# Patient Record
Sex: Female | Born: 1955 | Race: Black or African American | Hispanic: No | Smoking: Former smoker
Health system: Southern US, Community
[De-identification: ages and names within clinical notes are randomized; demographics above are authoritative.]

## PROBLEM LIST (undated history)

## (undated) DIAGNOSIS — Z531 Procedure and treatment not carried out because of patient's decision for reasons of belief and group pressure: Secondary | ICD-10-CM

## (undated) DIAGNOSIS — R519 Headache, unspecified: Secondary | ICD-10-CM

## (undated) DIAGNOSIS — K56609 Unspecified intestinal obstruction, unspecified as to partial versus complete obstruction: Secondary | ICD-10-CM

## (undated) DIAGNOSIS — Z8489 Family history of other specified conditions: Secondary | ICD-10-CM

## (undated) DIAGNOSIS — T7840XA Allergy, unspecified, initial encounter: Secondary | ICD-10-CM

## (undated) DIAGNOSIS — R011 Cardiac murmur, unspecified: Secondary | ICD-10-CM

## (undated) DIAGNOSIS — R51 Headache: Secondary | ICD-10-CM

## (undated) DIAGNOSIS — J189 Pneumonia, unspecified organism: Secondary | ICD-10-CM

## (undated) DIAGNOSIS — J302 Other seasonal allergic rhinitis: Secondary | ICD-10-CM

## (undated) DIAGNOSIS — G43909 Migraine, unspecified, not intractable, without status migrainosus: Secondary | ICD-10-CM

## (undated) DIAGNOSIS — IMO0001 Reserved for inherently not codable concepts without codable children: Secondary | ICD-10-CM

## (undated) DIAGNOSIS — K219 Gastro-esophageal reflux disease without esophagitis: Secondary | ICD-10-CM

## (undated) DIAGNOSIS — M1990 Unspecified osteoarthritis, unspecified site: Secondary | ICD-10-CM

## (undated) DIAGNOSIS — D649 Anemia, unspecified: Secondary | ICD-10-CM

## (undated) HISTORY — DX: Headache, unspecified: R51.9

## (undated) HISTORY — DX: Unspecified osteoarthritis, unspecified site: M19.90

## (undated) HISTORY — PX: DILATION AND CURETTAGE OF UTERUS: SHX78

## (undated) HISTORY — DX: Migraine, unspecified, not intractable, without status migrainosus: G43.909

## (undated) HISTORY — PX: ABDOMINAL HYSTERECTOMY: SHX81

## (undated) HISTORY — PX: TONSILLECTOMY AND ADENOIDECTOMY: SUR1326

## (undated) HISTORY — DX: Allergy, unspecified, initial encounter: T78.40XA

## (undated) HISTORY — PX: GANGLION CYST EXCISION: SHX1691

## (undated) HISTORY — PX: APPENDECTOMY: SHX54

## (undated) HISTORY — DX: Headache: R51

## (undated) HISTORY — DX: Cardiac murmur, unspecified: R01.1

---

## 1981-07-27 HISTORY — PX: EXPLORATORY LAPAROTOMY: SUR591

## 2008-07-27 HISTORY — PX: BREAST BIOPSY: SHX20

## 2015-08-21 ENCOUNTER — Ambulatory Visit (INDEPENDENT_AMBULATORY_CARE_PROVIDER_SITE_OTHER): Payer: 59 | Admitting: Internal Medicine

## 2015-08-21 ENCOUNTER — Encounter: Payer: Self-pay | Admitting: Internal Medicine

## 2015-08-21 VITALS — BP 110/78 | HR 73 | Temp 98.5°F | Resp 16 | Ht 63.5 in | Wt 214.0 lb

## 2015-08-21 DIAGNOSIS — J3089 Other allergic rhinitis: Secondary | ICD-10-CM | POA: Diagnosis not present

## 2015-08-21 DIAGNOSIS — J309 Allergic rhinitis, unspecified: Secondary | ICD-10-CM | POA: Insufficient documentation

## 2015-08-21 DIAGNOSIS — K219 Gastro-esophageal reflux disease without esophagitis: Secondary | ICD-10-CM

## 2015-08-21 DIAGNOSIS — E669 Obesity, unspecified: Secondary | ICD-10-CM | POA: Diagnosis not present

## 2015-08-21 NOTE — Patient Instructions (Signed)
We will send the cologuard out to your house to check for polyps. As we talked about there is a 5% miss rate with the test and about a 5% false positive rate.   We will get your records and see you back sometime for your physical this summer.   Keep working on exercise daily, when you are trying to lose weight the recommendation is 45 minutes a day for 4-5 days per week.   Exercising to Stay Healthy Exercising regularly is important. It has many health benefits, such as:  Improving your overall fitness, flexibility, and endurance.  Increasing your bone density.  Helping with weight control.  Decreasing your body fat.  Increasing your muscle strength.  Reducing stress and tension.  Improving your overall health. In order to become healthy and stay healthy, it is recommended that you do moderate-intensity and vigorous-intensity exercise. You can tell that you are exercising at a moderate intensity if you have a higher heart rate and faster breathing, but you are still able to hold a conversation. You can tell that you are exercising at a vigorous intensity if you are breathing much harder and faster and cannot hold a conversation while exercising. HOW OFTEN SHOULD I EXERCISE? Choose an activity that you enjoy and set realistic goals. Your health care provider can help you to make an activity plan that works for you. Exercise regularly as directed by your health care provider. This may include:   Doing resistance training twice each week, such as:  Push-ups.  Sit-ups.  Lifting weights.  Using resistance bands.  Doing a given intensity of exercise for a given amount of time. Choose from these options:  150 minutes of moderate-intensity exercise every week.  75 minutes of vigorous-intensity exercise every week.  A mix of moderate-intensity and vigorous-intensity exercise every week. Children, pregnant women, people who are out of shape, people who are overweight, and older  adults may need to consult a health care provider for individual recommendations. If you have any sort of medical condition, be sure to consult your health care provider before starting a new exercise program.  WHAT ARE SOME EXERCISE IDEAS? Some moderate-intensity exercise ideas include:   Walking at a rate of 1 mile in 15 minutes.  Biking.  Hiking.  Golfing.  Dancing. Some vigorous-intensity exercise ideas include:   Walking at a rate of at least 4.5 miles per hour.  Jogging or running at a rate of 5 miles per hour.  Biking at a rate of at least 10 miles per hour.  Lap swimming.  Roller-skating or in-line skating.  Cross-country skiing.  Vigorous competitive sports, such as football, basketball, and soccer.  Jumping rope.  Aerobic dancing. WHAT ARE SOME EVERYDAY ACTIVITIES THAT CAN HELP ME TO GET EXERCISE?  Yard work, such as:  Child psychotherapist.  Raking and bagging leaves.  Washing and waxing your car.  Pushing a stroller.  Shoveling snow.  Gardening.  Washing windows or floors. HOW CAN I BE MORE ACTIVE IN MY DAY-TO-DAY ACTIVITIES?  Use the stairs instead of the elevator.  Take a walk during your lunch break.  If you drive, park your car farther away from work or school.  If you take public transportation, get off one stop early and walk the rest of the way.  Make all of your phone calls while standing up and walking around.  Get up, stretch, and walk around every 30 minutes throughout the day. WHAT GUIDELINES SHOULD I FOLLOW WHILE EXERCISING?  Do not exercise so much that you hurt yourself, feel dizzy, or get very short of breath.  Consult your health care provider before starting a new exercise program.  Wear comfortable clothes and shoes with good support.  Drink plenty of water while you exercise to prevent dehydration or heat stroke. Body water is lost during exercise and must be replaced.  Work out until you breathe faster and your  heart beats faster.   This information is not intended to replace advice given to you by your health care provider. Make sure you discuss any questions you have with your health care provider.   Document Released: 08/15/2010 Document Revised: 08/03/2014 Document Reviewed: 12/14/2013 Elsevier Interactive Patient Education [employer]

## 2015-08-21 NOTE — Progress Notes (Signed)
Pre visit review using our clinic review tool, if applicable. No additional management support is needed unless otherwise documented below in the visit note. 

## 2015-08-21 NOTE — Assessment & Plan Note (Signed)
She is advised to avoid triggers. She will also work on weight loss to help with symptoms. She has never had colonoscopy and reminded her of the benefit with cancer screening. She agrees to cologuard which is ordered today.

## 2015-08-21 NOTE — Assessment & Plan Note (Signed)
She does not require medication at this time and does not want skin testing. She will avoid triggers and see if she is able to resume her old eye makeup in several weeks.

## 2015-08-21 NOTE — Assessment & Plan Note (Signed)
She will work on diet and exercise to help with her weight. She is up right now and does not like being this weight.

## 2015-08-21 NOTE — Progress Notes (Signed)
   Subjective:    Patient ID: Angela Shannon, female    DOB: [birth year], 60 y.o.   MRN: [id number]  HPI The patient is a new 60 YO female coming in for heart burn and some possible allergies. She does not take any medicines. Since moving to [facility] she has noticed some hypersensitivity of her sinuses. She has also had reaction to her eye makeup several times. Prior to the reaction she changed nails, had a hair treatment, and changed makeup. She has since stopped using facial makeup for a week or so and the reaction is better.  She has noticed night time GERD with red wine and so she switched to white wine. This has still given her reaction. She then stopped drinking wine about 5 weeks ago and has had no problems since then. She did have small reaction with some tomato sauce last week. No weight change. No abdominal pains. No diarrhea or constipation or blood in stool.   PMH, [facility], social history reviewed and updated.   Review of Systems  Constitutional: Negative for fever, activity change, appetite change, fatigue and unexpected weight change.  HENT: Positive for congestion and postnasal drip. Negative for nosebleeds, rhinorrhea, sore throat and trouble swallowing.   Eyes: Negative.   Respiratory: Negative for cough, chest tightness, shortness of breath and wheezing.   Cardiovascular: Negative for chest pain, palpitations and leg swelling.  Gastrointestinal: Negative for nausea, abdominal pain, diarrhea, constipation and abdominal distention.       GERD  Musculoskeletal: Negative for myalgias, back pain, arthralgias and neck pain.  Skin: Negative.   Neurological: Negative.   Psychiatric/Behavioral: Negative.       Objective:   Physical Exam  Constitutional: She is oriented to person, place, and time. She appears well-developed and well-nourished.  HENT:  Head: Normocephalic and atraumatic.  Mild redness of the oropharynx, no discharge or nasal crusting.  Eyes: EOM are normal.    Neck: Normal range of motion.  Cardiovascular: Normal rate and regular rhythm.   Pulmonary/Chest: Effort normal and breath sounds normal. No respiratory distress. She has no wheezes. She has no rales.  Abdominal: Soft. Bowel sounds are normal. She exhibits no distension. There is no tenderness. There is no rebound.  Musculoskeletal: She exhibits no edema.  Neurological: She is alert and oriented to person, place, and time. Coordination normal.  Skin: Skin is warm and dry.  Psychiatric: She has a normal mood and affect.   Filed Vitals:   08/21/15 1010  BP: 110/78  Pulse: 73  Temp: 98.5 F (36.9 C)  TempSrc: Oral  Resp: 16  Height: 5' 3.5" (1.613 m)  Weight: 214 lb (97.07 kg)  SpO2: 96%      Assessment & Plan:

## 2015-10-05 LAB — COLOGUARD

## 2015-11-27 ENCOUNTER — Encounter: Payer: Self-pay | Admitting: Internal Medicine

## 2015-11-27 ENCOUNTER — Ambulatory Visit (INDEPENDENT_AMBULATORY_CARE_PROVIDER_SITE_OTHER): Payer: 59 | Admitting: Internal Medicine

## 2015-11-27 ENCOUNTER — Other Ambulatory Visit (INDEPENDENT_AMBULATORY_CARE_PROVIDER_SITE_OTHER): Payer: 59

## 2015-11-27 VITALS — BP 104/70 | HR 79 | Temp 98.6°F | Resp 18 | Ht 63.5 in | Wt 219.0 lb

## 2015-11-27 DIAGNOSIS — Z0000 Encounter for general adult medical examination without abnormal findings: Secondary | ICD-10-CM

## 2015-11-27 LAB — COMPREHENSIVE METABOLIC PANEL
ALBUMIN: 4.1 g/dL (ref 3.5–5.2)
ALT: 9 U/L (ref 0–35)
AST: 12 U/L (ref 0–37)
Alkaline Phosphatase: 54 U/L (ref 39–117)
BUN: 14 mg/dL (ref 6–23)
CHLORIDE: 104 meq/L (ref 96–112)
CO2: 29 mEq/L (ref 19–32)
Calcium: 9.3 mg/dL (ref 8.4–10.5)
Creatinine, Ser: 0.88 mg/dL (ref 0.40–1.20)
GFR: 84.5 mL/min (ref >60.00)
Glucose, Bld: 101 mg/dL — ABNORMAL HIGH (ref 70–99)
POTASSIUM: 4 meq/L (ref 3.5–5.1)
SODIUM: 140 meq/L (ref 135–145)
Total Bilirubin: 0.8 mg/dL (ref 0.2–1.2)
Total Protein: 6.9 g/dL (ref 6.0–8.3)

## 2015-11-27 LAB — CBC
HEMATOCRIT: 40.8 % (ref 36.0–46.0)
Hemoglobin: 14 g/dL (ref 12.0–15.0)
MCHC: 34.4 g/dL (ref 30.0–36.0)
MCV: 85.7 fl (ref 78.0–100.0)
Platelets: 295 10*3/uL (ref 150.0–400.0)
RBC: 4.77 Mil/uL (ref 3.87–5.11)
RDW: 14 % (ref 11.5–15.5)
WBC: 5.6 10*3/uL (ref 4.0–10.5)

## 2015-11-27 LAB — LIPID PANEL
CHOLESTEROL: 171 mg/dL (ref 0–200)
HDL: 52.3 mg/dL (ref >39.00)
LDL CALC: 99 mg/dL (ref 0–99)
NonHDL: 119.07
TRIGLYCERIDES: 98 mg/dL (ref 0.0–149.0)
Total CHOL/HDL Ratio: 3
VLDL: 19.6 mg/dL (ref 0.0–40.0)

## 2015-11-27 LAB — HEMOGLOBIN A1C: HEMOGLOBIN A1C: 5.7 % (ref 4.6–6.5)

## 2015-11-27 NOTE — Progress Notes (Signed)
Pre visit review using our clinic review tool, if applicable. No additional management support is needed unless otherwise documented below in the visit note. 

## 2015-11-27 NOTE — Progress Notes (Signed)
   Subjective:    Patient ID: Angela Shannon, female    DOB: 28-Jun-1956, 60 y.o.   MRN: [id number]  HPI The patient is a 60 YO female coming in for wellness. No new concerns. Tried to do cologuard but was not able to complete due to cost. She does not want a colonoscopy.   PM[facility], [facility], social history reviewed and updated.   Review of Systems  Constitutional: Negative for fever, activity change, appetite change, fatigue and unexpected weight change.  HENT: Negative for nosebleeds, rhinorrhea, sore throat and trouble swallowing.   Eyes: Negative.   Respiratory: Negative for cough, chest tightness, shortness of breath and wheezing.   Cardiovascular: Negative for chest pain, palpitations and leg swelling.  Gastrointestinal: Negative for nausea, abdominal pain, diarrhea, constipation and abdominal distention.       GERD, mild  Musculoskeletal: Negative for myalgias, back pain, arthralgias and neck pain.  Skin: Negative.   Neurological: Negative.   Psychiatric/Behavioral: Negative.       Objective:   Physical Exam  Constitutional: She is oriented to person, place, and time. She appears well-developed and well-nourished.  HENT:  Head: Normocephalic and atraumatic.  Eyes: EOM are normal.  Neck: Normal range of motion.  Cardiovascular: Normal rate and regular rhythm.   Pulmonary/Chest: Effort normal and breath sounds normal. No respiratory distress. She has no wheezes. She has no rales.  Abdominal: Soft. Bowel sounds are normal. She exhibits no distension. There is no tenderness. There is no rebound.  Musculoskeletal: She exhibits no edema.  Neurological: She is alert and oriented to person, place, and time. Coordination normal.  Skin: Skin is warm and dry.  Psychiatric: She has a normal mood and affect.   Filed Vitals:   11/27/15 0930  BP: 104/70  Pulse: 79  Temp: 98.6 F (37 C)  TempSrc: Oral  Resp: 18  Height: 5' 3.5" (1.613 m)  Weight: 219 lb (99.338 kg)  SpO2: 98%     Assessment & Plan:

## 2015-11-27 NOTE — Assessment & Plan Note (Signed)
Not able to do cologuard due to cost at this time and does not want to do colonoscopy. Declines immunizations today. Checking labs and adjust as needed. Not on meds and non-smoker. Needs to start exercising. Given screening recommendations.

## 2015-11-27 NOTE — Patient Instructions (Signed)
We will get the blood work today and call you back with the results.   Come back in about 1 year for follow up.   Work on getting back into some exercise to help out with energy and stamina.   Exercising to Stay Healthy Exercising regularly is important. It has many health benefits, such as:  Improving your overall fitness, flexibility, and endurance.  Increasing your bone density.  Helping with weight control.  Decreasing your body fat.  Increasing your muscle strength.  Reducing stress and tension.  Improving your overall health. In order to become healthy and stay healthy, it is recommended that you do moderate-intensity and vigorous-intensity exercise. You can tell that you are exercising at a moderate intensity if you have a higher heart rate and faster breathing, but you are still able to hold a conversation. You can tell that you are exercising at a vigorous intensity if you are breathing much harder and faster and cannot hold a conversation while exercising. HOW OFTEN SHOULD I EXERCISE? Choose an activity that you enjoy and set realistic goals. Your health care provider can help you to make an activity plan that works for you. Exercise regularly as directed by your health care provider. This may include:   Doing resistance training twice each week, such as:  Push-ups.  Sit-ups.  Lifting weights.  Using resistance bands.  Doing a given intensity of exercise for a given amount of time. Choose from these options:  150 minutes of moderate-intensity exercise every week.  75 minutes of vigorous-intensity exercise every week.  A mix of moderate-intensity and vigorous-intensity exercise every week. Children, pregnant women, people who are out of shape, people who are overweight, and older adults may need to consult a health care provider for individual recommendations. If you have any sort of medical condition, be sure to consult your health care provider before starting a  new exercise program.  WHAT ARE SOME EXERCISE IDEAS? Some moderate-intensity exercise ideas include:   Walking at a rate of 1 mile in 15 minutes.  Biking.  Hiking.  Golfing.  Dancing. Some vigorous-intensity exercise ideas include:   Walking at a rate of at least 4.5 miles per hour.  Jogging or running at a rate of 5 miles per hour.  Biking at a rate of at least 10 miles per hour.  Lap swimming.  Roller-skating or in-line skating.  Cross-country skiing.  Vigorous competitive sports, such as football, basketball, and soccer.  Jumping rope.  Aerobic dancing. WHAT ARE SOME EVERYDAY ACTIVITIES THAT CAN HELP ME TO GET EXERCISE?  Yard work, such as:  Psychologist, educational.  Raking and bagging leaves.  Washing and waxing your car.  Pushing a stroller.  Shoveling snow.  Gardening.  Washing windows or floors. HOW CAN I BE MORE ACTIVE IN MY DAY-TO-DAY ACTIVITIES?  Use the stairs instead of the elevator.  Take a walk during your lunch break.  If you drive, park your car farther away from work or school.  If you take public transportation, get off one stop early and walk the rest of the way.  Make all of your phone calls while standing up and walking around.  Get up, stretch, and walk around every 30 minutes throughout the day. WHAT GUIDELINES SHOULD I FOLLOW WHILE EXERCISING?  Do not exercise so much that you hurt yourself, feel dizzy, or get very short of breath.  Consult your health care provider before starting a new exercise program.  Wear comfortable clothes and shoes  with good support.  Drink plenty of water while you exercise to prevent dehydration or heat stroke. Body water is lost during exercise and must be replaced.  Work out until you breathe faster and your heart beats faster.   This information is not intended to replace advice given to you by your health care provider. Make sure you discuss any questions you have with your health care  provider.   Document Released: 08/15/2010 Document Revised: 08/03/2014 Document Reviewed: 12/14/2013 Elsevier Interactive Patient Education 2016 [place] Maintenance, Female Adopting a healthy lifestyle and getting preventive care can go a long way to promote health and wellness. Talk with your health care provider about what schedule of regular examinations is right for you. This is a good chance for you to check in with your provider about disease prevention and staying healthy. In between checkups, there are plenty of things you can do on your own. Experts have done a lot of research about which lifestyle changes and preventive measures are most likely to keep you healthy. Ask your health care provider for more information. WEIGHT AND DIET  Eat a healthy diet  Be sure to include plenty of vegetables, fruits, low-fat dairy products, and lean protein.  Do not eat a lot of foods high in solid fats, added sugars, or salt.  Get regular exercise. This is one of the most important things you can do for your health.  Most adults should exercise for at least 150 minutes each week. The exercise should increase your heart rate and make you sweat (moderate-intensity exercise).  Most adults should also do strengthening exercises at least twice a week. This is in addition to the moderate-intensity exercise.  Maintain a healthy weight  Body mass index (BMI) is a measurement that can be used to identify possible weight problems. It estimates body fat based on height and weight. Your health care provider can help determine your BMI and help you achieve or maintain a healthy weight.  For females 5 years of age and older:   A BMI below 18.5 is considered underweight.  A BMI of 18.5 to 24.9 is normal.  A BMI of 25 to 29.9 is considered overweight.  A BMI of 30 and above is considered obese.  Watch levels of cholesterol and blood lipids  You should start having your blood tested  for lipids and cholesterol at 59 years of age, then have this test every 5 years.  You may need to have your cholesterol levels checked more often if:  Your lipid or cholesterol levels are high.  You are older than 59 years of age.  You are at high risk for heart disease.  CANCER SCREENING   Lung Cancer  Lung cancer screening is recommended for adults 43-65 years old who are at high risk for lung cancer because of a history of smoking.  A yearly low-dose CT scan of the lungs is recommended for people who:  Currently smoke.  Have quit within the past 15 years.  Have at least a 30-pack-year history of smoking. A pack year is smoking an average of one pack of cigarettes a day for 1 year.  Yearly screening should continue until it has been 15 years since you quit.  Yearly screening should stop if you develop a health problem that would prevent you from having lung cancer treatment.  Breast Cancer  Practice breast self-awareness. This means understanding how your breasts normally appear and feel.  It also means doing regular  breast self-exams. Let your health care provider know about any changes, no matter how small.  If you are in your 20s or 30s, you should have a clinical breast exam (CBE) by a health care provider every 1-3 years as part of a regular health exam.  If you are 64 or older, have a CBE every year. Also consider having a breast X-ray (mammogram) every year.  If you have a family history of breast cancer, talk to your health care provider about genetic screening.  If you are at high risk for breast cancer, talk to your health care provider about having an MRI and a mammogram every year.  Breast cancer gene (BRCA) assessment is recommended for women who have family members with BRCA-related cancers. BRCA-related cancers include:  Breast.  Ovarian.  Tubal.  Peritoneal cancers.  Results of the assessment will determine the need for genetic counseling and  BRCA1 and BRCA2 testing. Cervical Cancer Your health care provider may recommend that you be screened regularly for cancer of the pelvic organs (ovaries, uterus, and vagina). This screening involves a pelvic examination, including checking for microscopic changes to the surface of your cervix (Pap test). You may be encouraged to have this screening done every 3 years, beginning at age 22.  For women ages 36-65, health care providers may recommend pelvic exams and Pap testing every 3 years, or they may recommend the Pap and pelvic exam, combined with testing for human papilloma virus (HPV), every 5 years. Some types of HPV increase your risk of cervical cancer. Testing for HPV may also be done on women of any age with unclear Pap test results.  Other health care providers may not recommend any screening for nonpregnant women who are considered low risk for pelvic cancer and who do not have symptoms. Ask your health care provider if a screening pelvic exam is right for you.  If you have had past treatment for cervical cancer or a condition that could lead to cancer, you need Pap tests and screening for cancer for at least 20 years after your treatment. If Pap tests have been discontinued, your risk factors (such as having a new sexual partner) need to be reassessed to determine if screening should resume. Some women have medical problems that increase the chance of getting cervical cancer. In these cases, your health care provider may recommend more frequent screening and Pap tests. Colorectal Cancer  This type of cancer can be detected and often prevented.  Routine colorectal cancer screening usually begins at 59 years of age and continues through 59 years of age.  Your health care provider may recommend screening at an earlier age if you have risk factors for colon cancer.  Your health care provider may also recommend using home test kits to check for hidden blood in the stool.  A small camera at  the end of a tube can be used to examine your colon directly (sigmoidoscopy or colonoscopy). This is done to check for the earliest forms of colorectal cancer.  Routine screening usually begins at age 29.  Direct examination of the colon should be repeated every 5-10 years through 59 years of age. However, you may need to be screened more often if early forms of precancerous polyps or small growths are found. Skin Cancer  Check your skin from head to toe regularly.  Tell your health care provider about any new moles or changes in moles, especially if there is a change in a mole's shape or color.  Also tell your health care provider if you have a mole that is larger than the size of a pencil eraser.  Always use sunscreen. Apply sunscreen liberally and repeatedly throughout the day.  Protect yourself by wearing long sleeves, pants, a wide-brimmed hat, and sunglasses whenever you are outside. HEART DISEASE, DIABETES, AND HIGH BLOOD PRESSURE   High blood pressure causes heart disease and increases the risk of stroke. High blood pressure is more likely to develop in:  People who have blood pressure in the high end of the normal range (130-139/85-89 mm Hg).  People who are overweight or obese.  People who are African American.  If you are 37-17 years of age, have your blood pressure checked every 3-5 years. If you are 74 years of age or older, have your blood pressure checked every year. You should have your blood pressure measured twice--once when you are at a hospital or clinic, and once when you are not at a hospital or clinic. Record the average of the two measurements. To check your blood pressure when you are not at a hospital or clinic, you can use:  An automated blood pressure machine at a pharmacy.  A home blood pressure monitor.  If you are between 71 years and 45 years old, ask your health care provider if you should take aspirin to prevent strokes.  Have regular diabetes  screenings. This involves taking a blood sample to check your fasting blood sugar level.  If you are at a normal weight and have a low risk for diabetes, have this test once every three years after 59 years of age.  If you are overweight and have a high risk for diabetes, consider being tested at a younger age or more often. PREVENTING INFECTION  Hepatitis B  If you have a higher risk for hepatitis B, you should be screened for this virus. You are considered at high risk for hepatitis B if:  You were born in a country where hepatitis B is common. Ask your health care provider which countries are considered high risk.  Your parents were born in a high-risk country, and you have not been immunized against hepatitis B (hepatitis B vaccine).  You have HIV or AIDS.  You use needles to inject street drugs.  You live with someone who has hepatitis B.  You have had sex with someone who has hepatitis B.  You get hemodialysis treatment.  You take certain medicines for conditions, including cancer, organ transplantation, and autoimmune conditions. Hepatitis C  Blood testing is recommended for:  Everyone born from [birth year] through 1964.  Anyone with known risk factors for hepatitis C. Sexually transmitted infections (STIs)  You should be screened for sexually transmitted infections (STIs) including gonorrhea and chlamydia if:  You are sexually active and are younger than 59 years of age.  You are older than 59 years of age and your health care provider tells you that you are at risk for this type of infection.  Your sexual activity has changed since you were last screened and you are at an increased risk for chlamydia or gonorrhea. Ask your health care provider if you are at risk.  If you do not have HIV, but are at risk, it may be recommended that you take a prescription medicine daily to prevent HIV infection. This is called pre-exposure prophylaxis (PrEP). You are considered at risk  if:  You are sexually active and do not regularly use condoms or know the HIV status of your  partner(s).  You take drugs by injection.  You are sexually active with a partner who has HIV. Talk with your health care provider about whether you are at high risk of being infected with HIV. If you choose to begin PrEP, you should first be tested for HIV. You should then be tested every 3 months for as long as you are taking PrEP.  PREGNANCY   If you are premenopausal and you may become pregnant, ask your health care provider about preconception counseling.  If you may become pregnant, take 400 to 800 micrograms (mcg) of folic acid every day.  If you want to prevent pregnancy, talk to your health care provider about birth control (contraception). OSTEOPOROSIS AND MENOPAUSE   Osteoporosis is a disease in which the bones lose minerals and strength with aging. This can result in serious bone fractures. Your risk for osteoporosis can be identified using a bone density scan.  If you are 21 years of age or older, or if you are at risk for osteoporosis and fractures, ask your health care provider if you should be screened.  Ask your health care provider whether you should take a calcium or vitamin D supplement to lower your risk for osteoporosis.  Menopause may have certain physical symptoms and risks.  Hormone replacement therapy may reduce some of these symptoms and risks. Talk to your health care provider about whether hormone replacement therapy is right for you.  HOME CARE INSTRUCTIONS   Schedule regular health, dental, and eye exams.  Stay current with your immunizations.   Do not use any tobacco products including cigarettes, chewing tobacco, or electronic cigarettes.  If you are pregnant, do not drink alcohol.  If you are breastfeeding, limit how much and how often you drink alcohol.  Limit alcohol intake to no more than 1 drink per day for nonpregnant women. One drink equals 12  ounces of beer, 5 ounces of wine, or 1 ounces of hard liquor.  Do not use street drugs.  Do not share needles.  Ask your health care provider for help if you need support or information about quitting drugs.  Tell your health care provider if you often feel depressed.  Tell your health care provider if you have ever been abused or do not feel safe at home.   This information is not intended to replace advice given to you by your health care provider. Make sure you discuss any questions you have with your health care provider.   Document Released: 01/26/2011 Document Revised: 08/03/2014 Document Reviewed: 06/14/2013 Elsevier Interactive Patient Education [employer].

## 2015-12-06 ENCOUNTER — Encounter: Payer: Self-pay | Admitting: Geriatric Medicine

## 2016-03-17 ENCOUNTER — Ambulatory Visit (INDEPENDENT_AMBULATORY_CARE_PROVIDER_SITE_OTHER): Payer: 59 | Admitting: Internal Medicine

## 2016-03-17 ENCOUNTER — Encounter: Payer: Self-pay | Admitting: Internal Medicine

## 2016-03-17 VITALS — BP 120/78 | HR 83 | Temp 99.0°F | Resp 12 | Ht 64.0 in | Wt 220.8 lb

## 2016-03-17 DIAGNOSIS — Z91018 Allergy to other foods: Secondary | ICD-10-CM | POA: Insufficient documentation

## 2016-03-17 MED ORDER — EPINEPHRINE 0.3 MG/0.3ML IJ SOAJ: 0.3000 mg | Freq: Once | INTRAMUSCULAR | 0 refills | Status: AC

## 2016-03-17 NOTE — Progress Notes (Signed)
Pre visit review using our clinic review tool, if applicable. No additional management support is needed unless otherwise documented below in the visit note. 

## 2016-03-17 NOTE — Patient Instructions (Signed)
We will have you avoid fish until you get in to the allergy doctor to get tested for food allergies.   We have sent in a prescription for the epipen so that you can keep it on hand in case you have a reaction to food.    Food Allergy A food allergy is an abnormal reaction to a food (food allergen) by the body's defense system (immune system). Foods that commonly cause allergies are:  Milk.  Seafood.  Eggs.  Nuts.  Wheat.  Soy. CAUSES Food allergies happen when the immune system mistakenly sees a food as harmful and releases antibodies to fight it. SIGNS AND SYMPTOMS Symptoms may be mild or severe. They usually start minutes after the food is eaten, but they can occur even a few hours later. In people with a severe allergy, symptoms can start within seconds. Mild Symptoms  Nasal congestion.  Tingling in the mouth.  An itchy, red rash.  Vomiting.  Diarrhea. Severe Symptoms  Swelling of the lips, face, and tongue.  Swelling of the back of the mouth and throat.  Wheezing.  A hoarse voice.  Itchy, red, swollen areas of skin (hives).  Dizziness or light-headedness.  Fainting.  Trouble breathing, speaking, or swallowing.  Chest tightness.  Rapid heartbeat. DIAGNOSIS A diagnosis is made with a physical exam, medical and family history, and one or more of the following:  Skin tests.  Blood tests.  A food diary.  The results of an elimination diet. The elimination diet involves removing foods from your diet and then adding them back in, one at a time. TREATMENT There is no cure for allergies. An allergic reaction can be treated with medicines, such as:  Antihistamines.  Steroids.  Respiratory inhalers.  Epinephrine. Severe symptoms can be a sign of a life-threatening reaction called anaphylaxis, and they require immediate treatment. Severe reactions usually need to be treated at a hospital. People who have had a severe reaction may be prescribed rescue  medicines to take if they are accidentally exposed to an allergen. HOME CARE INSTRUCTIONS General Instructions  Avoid the foods that you are allergic to.  Read food labels before you eat packaged items. Look for ingredients you are allergic to.  When you are at a restaurant, tell your server that you have an allergy. If you are unsure of whether a meal has an ingredient that you are allergic to, ask your server.  Take medicines only as directed by your health care provider. Do not drive until the medicine has worn off, unless your health care provider gives you approval.  Inform all health care providers that you have a food allergy.  Contact your health care provider if you want to be tested for an allergy. If you have had an anaphylactic reaction before, you should never test yourself for an allergy without your health care provider's approval. Instructions for People with Severe Allergies  Wear a medical alert bracelet or necklace that describes your allergy.  Carry your anaphylaxis kit or an epinephrine injection with you at all times. Use them as directed by your health care provider.  Make sure that you, your family members, and your employer know:  How to use an anaphylaxis kit.  How to give an epinephrine injection.  Replace your epinephrine immediately after use, in case you have another reaction.  Seek medical care even after you take epinephrine. This is important because epinephrine can be followed by a delayed, life-threatening reaction. Instructions for People with a Potential Allergy  Follow the elimination diet as directed by your health care provider.  Keep a food diary as directed by your health care provider. Every day, write down:  What you eat and drink and when.  What symptoms you have and when. SEEK MEDICAL CARE IF:  Your symptoms have not gone away within 2 days.  Your symptoms get worse.  You develop new symptoms. SEEK IMMEDIATE MEDICAL CARE  IF:  You use epinephrine.  You are having a severe allergic reaction. Symptoms of a severe reaction include:  Swelling of the lips, face, and tongue.  Swelling of the back of the mouth and throat.  Wheezing.  A hoarse voice.  Hives.  Dizziness or light-headedness.  Fainting.  Trouble breathing, speaking, or swallowing.  Chest tightness.  Rapid heartbeat.   This information is not intended to replace advice given to you by your health care provider. Make sure you discuss any questions you have with your health care provider.   Document Released: 07/10/2000 Document Revised: 08/03/2014 Document Reviewed: 04/24/2014 Elsevier Interactive Patient Education [employer].

## 2016-03-17 NOTE — Assessment & Plan Note (Signed)
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 Advised her to abstain from any seafood until she can get formal allergy testing. Epipen rx sent in at visit and talked to her about usage. If symptoms present she will seek care.

## 2016-03-17 NOTE — Progress Notes (Signed)
   Subjective:    Patient ID: Angela Shannon, female    DOB: [birth year], 60 y.o.   MRN: [id number]  HPI The patient is a 60 YO female coming in for some food reactions was eating some clams and about 20 minutes later felt the glands in her neck swell. She did not eat any SOB or problems breathing. No swelling in her lips. She denies fevers or chills. She took benadryl and the swelling went down. She then ate some shrimp the next week or so and was scared and took benadryl with the meal. She had mild swelling in her neck and lower jaw the next morning which went away on its own. She has not had allergy to food in the past. She has eaten seafood in the past many times without problems. She does have seasonal allergies and her skin is very sensitive to soaps. She cannot wear makeup without allergic reaction. Has also been skipping some meals and getting lightheaded when going outside this summer. She does eat small amounts and admits to getting cravings for food when she does not eat. She does not usually eat breakfast. She is now scared to eat foods and this is making her symptoms worse with the eating habits.   Review of Systems  Constitutional: Negative.   HENT: Positive for facial swelling. Negative for congestion, ear discharge, ear pain, mouth sores, nosebleeds, postnasal drip, sinus pressure, sore throat and trouble swallowing.   Eyes: Negative.   Respiratory: Negative.   Cardiovascular: Negative.   Gastrointestinal: Negative.   Musculoskeletal: Negative.   Neurological: Positive for light-headedness. Negative for dizziness, syncope and weakness.      Objective:   Physical Exam  Constitutional: She is oriented to person, place, and time. She appears well-developed and well-nourished.  HENT:  Head: Normocephalic and atraumatic.  No swelling on exam.   Eyes: EOM are normal.  Neck: Normal range of motion.  Cardiovascular: Normal rate and regular rhythm.   Pulmonary/Chest: Effort  normal and breath sounds normal. No respiratory distress. She has no wheezes. She has no rales.  Abdominal: Soft. Bowel sounds are normal. She exhibits no distension. There is no tenderness. There is no rebound.  Musculoskeletal: She exhibits no edema.  Neurological: She is alert and oriented to person, place, and time. Coordination normal.  Skin: Skin is warm and dry.   Vitals:   03/17/16 0936  BP: 120/78  Pulse: 83  Resp: 12  Temp: 99 F (37.2 C)  TempSrc: Oral  SpO2: 98%  Weight: 220 lb 12.8 oz (100.2 kg)  Height: 5\' 4"  (1.626 m)      Assessment & Plan:

## 2016-04-30 ENCOUNTER — Ambulatory Visit: Payer: Self-pay | Admitting: Allergy & Immunology

## 2016-05-28 ENCOUNTER — Encounter: Payer: Self-pay | Admitting: Allergy & Immunology

## 2016-05-28 ENCOUNTER — Ambulatory Visit (INDEPENDENT_AMBULATORY_CARE_PROVIDER_SITE_OTHER): Payer: 59 | Admitting: Allergy & Immunology

## 2016-05-28 ENCOUNTER — Encounter (INDEPENDENT_AMBULATORY_CARE_PROVIDER_SITE_OTHER): Payer: Self-pay

## 2016-05-28 VITALS — BP 150/80 | HR 88 | Temp 98.5°F | Resp 16 | Ht 63.5 in | Wt 221.6 lb

## 2016-05-28 DIAGNOSIS — L239 Allergic contact dermatitis, unspecified cause: Secondary | ICD-10-CM | POA: Diagnosis not present

## 2016-05-28 DIAGNOSIS — T6391XD Toxic effect of contact with unspecified venomous animal, accidental (unintentional), subsequent encounter: Secondary | ICD-10-CM

## 2016-05-28 DIAGNOSIS — T7819XD Other adverse food reactions, not elsewhere classified, subsequent encounter: Secondary | ICD-10-CM

## 2016-05-28 DIAGNOSIS — T781XXD Other adverse food reactions, not elsewhere classified, subsequent encounter: Secondary | ICD-10-CM

## 2016-05-28 DIAGNOSIS — J310 Chronic rhinitis: Secondary | ICD-10-CM

## 2016-05-28 LAB — CBC WITH DIFFERENTIAL/PLATELET
BASOS ABS: 57 {cells}/uL (ref 0–200)
Basophils Relative: 1 %
EOS PCT: 2 %
Eosinophils Absolute: 114 cells/uL (ref 15–500)
HCT: 41.9 % (ref 35.0–45.0)
HEMOGLOBIN: 13.9 g/dL (ref 11.7–15.5)
LYMPHS ABS: 2166 {cells}/uL (ref 850–3900)
Lymphocytes Relative: 38 %
MCH: 29 pg (ref 27.0–33.0)
MCHC: 33.2 g/dL (ref 32.0–36.0)
MCV: 87.5 fL (ref 80.0–100.0)
MPV: 10 fL (ref 7.5–12.5)
Monocytes Absolute: 285 cells/uL (ref 200–950)
Monocytes Relative: 5 %
NEUTROS ABS: 3078 {cells}/uL (ref 1500–7800)
Neutrophils Relative %: 54 %
Platelets: 306 10*3/uL (ref 140–400)
RBC: 4.79 MIL/uL (ref 3.80–5.10)
RDW: 14.7 % (ref 11.0–15.0)
WBC: 5.7 10*3/uL (ref 3.8–10.8)

## 2016-05-28 NOTE — Progress Notes (Signed)
NEW PATIENT  Date of Service/Encounter:  05/28/16   Assessment:   Adverse food reaction, subsequent encounter - Plan: Tryptase, CBC with Differential/Platelet, Allergen Zone 3  Allergic contact dermatitis, unspecified trigger  Chronic rhinitis, unspecified type  Toxic effect of venom, accidental or unintentional, subsequent encounter - Plan: Stinging insect panel IgE    Plan/Recommendations:   1. Allergic contact dermatitis - We will plan to do patch testing, which needs to be placed on a Monday with a read on Wednesday and Friday. - Schedule this on your way out.   - Differences between contact dermatitis and IgE-mediated reactions discussed.  2. Chronic rhinitis - Testing today showed: positives to two weed and one mold - Use cetirizine 31m as needed for breakthrough symptoms. - We will get a blood test to look for environmental allergens as well.  - Deferred intradermal testing since her symptoms were not severe.   3. Adverse food reaction, subsequent encounter - Testing showed: negative to shellfish mix, fish mix, onion, and sweet potato - Unfortunately we do not have lab testing for ginseng and chicory, but continue to avoid for now. - Take pictures of the ingredient lists for any triggering prepared meals. - We will get lab testing to rule out serious causes of allergic reactions: tryptase (to rule out mast cell disorders) and a complete blood count with differential (to rule out eosinophil disorders).  4. Concern for venom hypersensitivity - We will obtain testing to look for venom hypersensitivity.  - Her symptoms are more consistent with a large local reaction since there were no systemic symptoms. - However, large local reactions were an added indication for venom immunotherapy in the 2016 Practice Parameter Update.  - Her reaction is certainly not as serious as others.  - Will obtain the testing and then talk to the patient about her interest in pursuing venom  immunotherapy.  5. Return in about 3 months (around 08/28/2016).   Subjective:   Angela WHITMILLis a 60y.o. female presenting today for evaluation of  Chief Complaint  Patient presents with  . Allergic Reaction    Sunscreen; Chickery root  .  Angela Shannon has a history of the following: Patient Active Problem List   Diagnosis Date Noted  . Food allergy 03/17/2016  . Routine general medical examination at a health care facility 11/27/2015  . Obesity 08/21/2015  . GERD (gastroesophageal reflux disease) 08/21/2015  . Allergic rhinitis 08/21/2015    History obtained from: chart review and patient.  Angela Shannon was referred by EHoyt Koch MD.     Angela Shannon a 60y.o. female presenting for environmental and food allergy testing. She has multiple complaints today, most of which center around food.   Food Symptom History: In June 2016, she was drinking red wine. Within 30-60 minutes she developed flushing and then abdominal pain with a headache. Symptoms persisted over the course of the next 24 hours. She felt that she might be having an allergic reaction, therefore she purchased some Benadryl and took some the next morning with improvement in her symptoms. She has since avoided red wine tolerates white wine without a problem.   She has also had several other reactions associated with foods. Her reactions are fairly subjective and include lightheadedness, fatigue, "sick" feeling, with occasional headaches. She also endorses sinus issues. One episode was triggered by ingestion of a [employer] bar which contained dark chocolate and almond. 20 minutes after eating this, she developed "gland swelling" in  her neck that resulted in ear pressure and pain. She could breathe fine during the event and had no stomach pain. She also had that feeling of "sickness". She read the label and noticed that the bar contained chicory root. Since she tolerated all of the other  ingredients without a problem, she felt that this must of been the reason for her symptoms. These symptoms lasted one week, during which time she took Benadryl with gradual improvement.  She also had a similar reaction when she ate chicken soup that contain ginseng. It also had turnips and mushrooms as well. Shortly after eating the soup, she had that "allergic feeling" in her face. She has since had mushrooms as well as chicken without a problem. She has avoided ginseng. A similar clinical course occurred when she had clam sauce at another occasion. She tolerates all seafood without a problem, however. Another episode occurred when she had sweet potato chips with sea salt purchased from [place]. She has eaten sweet potatoes since that time without a problem.  Skin Symptom History: In January 2017, she purchased [place] skin care products. Then she also got her nails done and developed skin issues with redness and itching wherever the makeup was applied. She developed facial redness and overall "sick feeling". She has eliminated things to try to determine what it was. She got to the point when she was just using chap stick instead of other makeup products. She noticed that her chap stick had sunscreen in it which she felt might be triggering it, so she stopped using it. She has found some mineral based makeup products which she seems to tolerate well. She did not use any topical steroids with these episodes.  Allergic Rhinitis Symptom History:  Overall, seasonal allergies are not a huge issue for her. She does have problems with pollens leading to coughing. She does use peppermints to help with that. She has never tried antihistamines or nasal sprays routinely. She has never been allergy tested in the past. She denies worsening of her symptoms indoors versus outdoors. She has not noted worsening of her symptoms around any animals.  Angela Shannon was bit on her left foot by a wasp or hornet. She developed  large local swelling up past her ankle and went away on her leg. This persisted for 2-3 weeks. She had no other systemic symptoms. She did not require prednisone or epinephrine. She has a history of cysts in her fingers that were diagnosed as osteoarthritis as well as a ganglion cyst in her left wrist. Otherwise, there is no history of other atopic diseases, including asthma, drug allergies, or urticaria. There is no significant infectious history. Vaccinations are up to date.    Past Medical History: Patient Active Problem List   Diagnosis Date Noted  . Food allergy 03/17/2016  . Routine general medical examination at a health care facility 11/27/2015  . Obesity 08/21/2015  . GERD (gastroesophageal reflux disease) 08/21/2015  . Allergic rhinitis 08/21/2015    Medication List:    Medication List       Accurate as of 05/28/16 12:32 PM. Always use your most recent med list.          diphenhydrAMINE 25 MG tablet Commonly known as:  BENADRYL Take 25 mg by mouth as needed.   ibuprofen 200 MG tablet Commonly known as:  ADVIL,MOTRIN Take 200 mg by mouth as needed.       Birth History: non-contributory. Born at term without complications.   Developmental History: Angela Shannon  has met all milestones on time. She has required no speech therapy, occupational therapy, or physical therapy.   Past Surgical History: Past Surgical History:  Procedure Laterality Date  . ABDOMINAL HYSTERECTOMY    . intestinal blockage    . TONSILLECTOMY AND ADENOIDECTOMY       Family History: Family History  Problem Relation Age of Onset  . Arthritis Mother   . Heart disease Maternal Grandmother   . Stroke Maternal Grandmother   . Diabetes Maternal Grandmother   . Hypertension Paternal Grandmother   . Cancer Sister     breast  . Cancer Paternal Aunt     breast     Social History: Angela Shannon lives In an apartment. There is carpeting throughout the apartment. She has electric heating and central  cooling. There are no animals inside the home. There are animals around her apartment. She does not use dust mite covers for the better pillows. There are no problems with roaches or rodents. 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 She did live in [place] but moved to [place] approximately one year ago.    Review of Systems: a 14-point review of systems is pertinent for what is mentioned in HPI.  Otherwise, all other systems were negative. Constitutional: negative other than that listed in the HPI Eyes: negative other than that listed in the HPI Ears, nose, mouth, throat, and face: negative other than that listed in the HPI Respiratory: negative other than that listed in the HPI Cardiovascular: negative other than that listed in the HPI Gastrointestinal: negative other than that listed in the HPI Genitourinary: negative other than that listed in the HPI Integument: negative other than that listed in the HPI Hematologic: negative other than that listed in the HPI Musculoskeletal: negative other than that listed in the HPI Neurological: negative other than that listed in the HPI Allergy/Immunologic: negative other than that listed in the HPI    Objective:   Blood pressure (!) 150/80, pulse 88, temperature 98.5 F (36.9 C), temperature source Oral, resp. rate 16, height 5' 3.5" (1.613 m), weight 221 lb 9.6 oz (100.5 kg), SpO2 99 %. Body mass index is 38.64 kg/m.   Physical Exam:  General: Alert, interactive, in no acute distress. Cooperative with the exam. Very talkative. HEENT: TMs pearly gray, turbinates edematous without discharge, post-pharynx mildly erythematous. Neck: Supple without thyromegaly. Adenopathy: no enlarged lymph nodes appreciated in the anterior cervical, occipital, axillary, epitrochlear, inguinal, or popliteal regions Lungs: Clear to auscultation without wheezing, rhonchi or rales. No increased work of  breathing. CV: Normal S1/S2, no murmurs. Capillary refill <2 seconds.  Abdomen: Nondistended, nontender. No guarding or rebound tenderness. Bowel sounds present in all fields and hyperactive  Skin: Warm and dry, without lesions or rashes. Extremities:  No clubbing, cyanosis or edema. Nodules along some of the joints in the fingers.  Neuro:   Grossly intact.   Diagnostic studies:   Allergy Studies:   Indoor/Outdoor Percutaneous Adult Environmental Panel: Positive to Spencer elder, she several, and Aspergillus. Overall, she was fairly dramatographic therefore interpretation was difficult.  Selected Foods Panel: Negative to shellfish mix, fish mix, sweet potato, and onion with adequate controls    Salvatore Marvel, MD [facility]

## 2016-05-28 NOTE — Patient Instructions (Addendum)
1. Allergic contact dermatitis - We will plan to do patch testing, which needs to be placed on a Monday and read on Wednesday and Friday. - Schedule this on your way out.   2. Chronic rhinitis - Testing today showed: positives to two weed and one mold - Use cetirizine 10mg  as needed for breakthrough symptoms. - We will get a blood test to look for environmental allergens as well.   3. Adverse food reaction, subsequent encounter - Testing showed: negative to shellfish mix, fish mix, onion, and sweet potato - Unfortunately we do not have lab testing for ginseng and chicory, but continue to avoid for now. - Take pictures of the ingredient lists for any triggering foods. - We will get lab testing to rule out serious causes of allergic reactions: tryptase (to rule out mast cell disorders) and a complete blood count with differential (to rule out eosinophil disorders). - We will also get testing to look for venom hypersensitivity.   4. Return in about 3 months (around 08/28/2016).  Please inform us of any Emergency Department visits, hospitalizations, or changes in symptoms. Call us before going to the ED for breathing or allergy symptoms since we might be able to fit you in for a sick visit. Feel free to contact us anytime with any questions, problems, or concerns.  It was a pleasure to meet you today!   Websites that have reliable patient information: 1. American Academy of Asthma, Allergy, and Immunology: www.aaaai.org 2. Food Allergy Research and Education (FARE): foodallergy.org 3. Mothers of Asthmatics: http://www.asthmacommunitynetwork.org 4. American College of Allergy, Asthma, and Immunology: www.acaai.org

## 2016-05-29 LAB — CP584 ZONE 3
Allergen, A. alternata, m6: <0.1 kU/L
Allergen, Black Locust, Acacia9: <0.1 kU/L
Allergen, Cedar tree, t12: <0.1 kU/L
Allergen, Comm Silver Birch, t9: <0.1 kU/L
Allergen, Oak,t7: <0.1 kU/L
Allergen, S. Botryosum, m10: <0.1 kU/L
Box Elder IgE: <0.1 kU/L
Cat Dander: <0.1 kU/L
Dog Dander: <0.1 kU/L
Johnson Grass: <0.1 kU/L
Nettle: <0.1 kU/L
Pecan/Hickory Tree IgE: <0.1 kU/L
Plantain: <0.1 kU/L
Rough Pigweed  IgE: <0.1 kU/L

## 2016-05-29 LAB — TRYPTASE: TRYPTASE: 11.5 ug/L — AB (ref <11)

## 2016-06-15 ENCOUNTER — Telehealth: Payer: Self-pay

## 2016-06-15 DIAGNOSIS — T7840XD Allergy, unspecified, subsequent encounter: Secondary | ICD-10-CM

## 2016-06-15 NOTE — Telephone Encounter (Signed)
-----   Message from Joel Louis Gallagher, MD sent at 06/04/2016  9:20 AM EST ----- Could someone call Angela Shannon to discuss her labs? Environmental allergy testing was all negative. Her CBC was normal as well. We did check a serum tryptase, which is a marker of mast cell activity, which was borderline elevated. However I think we can just recheck that at the next visit. I did not see a stinging insect panel result. Could we call the lab to see where things stand on that?  Thanks!   Joel Gallagher, MD [facility]  

## 2016-06-15 NOTE — Telephone Encounter (Signed)
-----   Message from Alfonse SpruceJoel Louis Gallagher, MD sent at 06/04/2016  9:20 AM EST ----- Could someone call Ms. Garramone to discuss her labs? Environmental allergy testing was all negative. Her CBC was normal as well. We did check a serum tryptase, which is a marker of mast cell activity, which was borderline elevated. However I think we can just recheck that at the next visit. I did not see a stinging insect panel result. Could we call the lab to see where things stand on that?  Thanks!   Malachi BondsJoel Gallagher, MD F[facility]I [facility]

## 2016-06-15 NOTE — Telephone Encounter (Signed)
Additional lab [facility] be done [facility]   W. R. BerkleyStinging insect panel is listed as Hymenoptera(Bee Venom) profile Test Code [id number]. I will have the new requisition upfront for patient to pick up to have additional lab work done.

## 2016-06-15 NOTE — Telephone Encounter (Signed)
-----   Message from Joel Louis Gallagher, MD sent at 06/04/2016  9:20 AM EST ----- Could someone call Ms. Vetrano to discuss her labs? Environmental allergy testing was all negative. Her CBC was normal as well. We did check a serum tryptase, which is a marker of mast cell activity, which was borderline elevated. However I think we can just recheck that at the next visit. I did not see a stinging insect panel result. Could we call the lab to see where things stand on that?  Thanks!   Joel Gallagher, MD [facility]  

## 2016-06-15 NOTE — Telephone Encounter (Signed)
Clld pt - LMOVMTC re lab results.    When patient returns call  please advise her the stinging insect panel blood work has to be done at [facility] of [facility]. I will reprint the [facility] requisition and leave it upfront for her to pick up.

## 2016-06-15 NOTE — Telephone Encounter (Signed)
Returned pt's call  - advsd of lab results and need for additional lab work for the Designer, fashion/clothingtinging insect panel. Pt stated she understood and would come by to pick up paper work.

## 2016-06-30 ENCOUNTER — Other Ambulatory Visit: Payer: Self-pay | Admitting: Internal Medicine

## 2016-06-30 DIAGNOSIS — Z1231 Encounter for screening mammogram for malignant neoplasm of breast: Secondary | ICD-10-CM

## 2016-07-23 ENCOUNTER — Ambulatory Visit
Admission: RE | Admit: 2016-07-23 | Discharge: 2016-07-23 | Disposition: A | Discharge status: Home / Self Care | Payer: 59 | Source: Ambulatory Visit | Attending: Internal Medicine | Admitting: Internal Medicine

## 2016-07-23 DIAGNOSIS — Z1231 Encounter for screening mammogram for malignant neoplasm of breast: Secondary | ICD-10-CM

## 2016-08-28 ENCOUNTER — Ambulatory Visit: Payer: 59 | Admitting: Allergy

## 2016-09-17 ENCOUNTER — Encounter (HOSPITAL_COMMUNITY): Payer: Self-pay | Admitting: Emergency Medicine

## 2016-09-17 ENCOUNTER — Emergency Department (HOSPITAL_COMMUNITY): Payer: 59

## 2016-09-17 ENCOUNTER — Observation Stay (HOSPITAL_COMMUNITY): Payer: 59

## 2016-09-17 ENCOUNTER — Observation Stay (HOSPITAL_COMMUNITY)
Admission: EM | Admit: 2016-09-17 | Discharge: 2016-09-19 | Disposition: A | Discharge status: Home / Self Care | Payer: 59 | Attending: General Surgery | Admitting: General Surgery

## 2016-09-17 DIAGNOSIS — Z87891 Personal history of nicotine dependence: Secondary | ICD-10-CM | POA: Insufficient documentation

## 2016-09-17 DIAGNOSIS — Z6837 Body mass index (BMI) 37.0-37.9, adult: Secondary | ICD-10-CM | POA: Diagnosis not present

## 2016-09-17 DIAGNOSIS — R112 Nausea with vomiting, unspecified: Secondary | ICD-10-CM | POA: Diagnosis present

## 2016-09-17 DIAGNOSIS — K56609 Unspecified intestinal obstruction, unspecified as to partial versus complete obstruction: Secondary | ICD-10-CM | POA: Diagnosis not present

## 2016-09-17 DIAGNOSIS — J302 Other seasonal allergic rhinitis: Secondary | ICD-10-CM | POA: Diagnosis not present

## 2016-09-17 DIAGNOSIS — Z0189 Encounter for other specified special examinations: Secondary | ICD-10-CM

## 2016-09-17 DIAGNOSIS — E669 Obesity, unspecified: Secondary | ICD-10-CM | POA: Insufficient documentation

## 2016-09-17 HISTORY — DX: Gastro-esophageal reflux disease without esophagitis: K21.9

## 2016-09-17 HISTORY — DX: Anemia, unspecified: D64.9

## 2016-09-17 HISTORY — DX: Reserved for inherently not codable concepts without codable children: IMO0001

## 2016-09-17 HISTORY — DX: Procedure and treatment not carried out because of patient's decision for reasons of belief and group pressure: Z53.1

## 2016-09-17 HISTORY — DX: Unspecified intestinal obstruction, unspecified as to partial versus complete obstruction: K56.609

## 2016-09-17 HISTORY — DX: Pneumonia, unspecified organism: J18.9

## 2016-09-17 HISTORY — DX: Family history of other specified conditions: Z84.89

## 2016-09-17 HISTORY — DX: Other seasonal allergic rhinitis: J30.2

## 2016-09-17 LAB — CBC
HCT: 38.8 % (ref 36.0–46.0)
HEMATOCRIT: 43.9 % (ref 36.0–46.0)
Hemoglobin: 14.7 g/dL (ref 12.0–15.0)
Hemoglobin: 12.9 g/dL (ref 12.0–15.0)
MCH: 29.4 pg (ref 26.0–34.0)
MCH: 29.4 pg (ref 26.0–34.0)
MCHC: 33.5 g/dL (ref 30.0–36.0)
MCHC: 33.2 g/dL (ref 30.0–36.0)
MCV: 87.8 fL (ref 78.0–100.0)
MCV: 88.4 fL (ref 78.0–100.0)
PLATELETS: 195 10*3/uL (ref 150–400)
PLATELETS: 285 10*3/uL (ref 150–400)
RBC: 5 MIL/uL (ref 3.87–5.11)
RBC: 4.39 MIL/uL (ref 3.87–5.11)
RDW: 14.4 % (ref 11.5–15.5)
RDW: 14.4 % (ref 11.5–15.5)
WBC: 10.7 10*3/uL — AB (ref 4.0–10.5)
WBC: 10.4 10*3/uL (ref 4.0–10.5)

## 2016-09-17 LAB — COMPREHENSIVE METABOLIC PANEL
ALT: 14 U/L (ref 14–54)
AST: 21 U/L (ref 15–41)
Albumin: 4.3 g/dL (ref 3.5–5.0)
Alkaline Phosphatase: 58 U/L (ref 38–126)
Anion gap: 12 (ref 5–15)
BUN: 9 mg/dL (ref 6–20)
CHLORIDE: 100 mmol/L — AB (ref 101–111)
CO2: 27 mmol/L (ref 22–32)
CREATININE: 1.04 mg/dL — AB (ref 0.44–1.00)
Calcium: 9.9 mg/dL (ref 8.9–10.3)
GFR calc Af Amer: >60 mL/min (ref >60)
GFR, EST NON AFRICAN AMERICAN: 57 mL/min — AB (ref >60)
Glucose, Bld: 157 mg/dL — ABNORMAL HIGH (ref 65–99)
POTASSIUM: 4 mmol/L (ref 3.5–5.1)
Sodium: 139 mmol/L (ref 135–145)
Total Bilirubin: 0.9 mg/dL (ref 0.3–1.2)
Total Protein: 7.2 g/dL (ref 6.5–8.1)

## 2016-09-17 LAB — URINALYSIS, ROUTINE W REFLEX MICROSCOPIC
BILIRUBIN URINE: NEGATIVE
Glucose, UA: NEGATIVE mg/dL
KETONES UR: NEGATIVE mg/dL
LEUKOCYTES UA: NEGATIVE
Nitrite: NEGATIVE
Protein, ur: NEGATIVE mg/dL
pH: 7 (ref 5.0–8.0)

## 2016-09-17 LAB — CREATININE, SERUM: Creatinine, Ser: 0.94 mg/dL (ref 0.44–1.00)

## 2016-09-17 LAB — HIV ANTIBODY (ROUTINE TESTING W REFLEX): HIV SCREEN 4TH GENERATION: NONREACTIVE

## 2016-09-17 LAB — LIPASE, BLOOD: LIPASE: 29 U/L (ref 11–51)

## 2016-09-17 MED ORDER — DIPHENHYDRAMINE HCL 50 MG/ML IJ SOLN: 12.5000 mg | Freq: Four times a day (QID) | INTRAMUSCULAR | Status: DC | PRN

## 2016-09-17 MED ORDER — ACETAMINOPHEN 650 MG RE SUPP: 650.0000 mg | Freq: Four times a day (QID) | RECTAL | Status: DC | PRN

## 2016-09-17 MED ORDER — ONDANSETRON 4 MG PO TBDP: 4.0000 mg | ORAL_TABLET | Freq: Four times a day (QID) | ORAL | Status: DC | PRN

## 2016-09-17 MED ORDER — ENOXAPARIN SODIUM 40 MG/0.4ML ~~LOC~~ SOLN
40.0000 mg | SUBCUTANEOUS | Status: DC
  Administered 2016-09-17 – 2016-09-19 (×3): 40 mg via SUBCUTANEOUS
  Filled 2016-09-17 (×3): qty 0.4

## 2016-09-17 MED ORDER — ONDANSETRON HCL 4 MG/2ML IJ SOLN
4.0000 mg | Freq: Four times a day (QID) | INTRAMUSCULAR | Status: DC | PRN
Start: 2016-09-17 — End: 2016-09-19
  Administered 2016-09-17: 4 mg via INTRAVENOUS
  Filled 2016-09-17: qty 2

## 2016-09-17 MED ORDER — IOPAMIDOL (ISOVUE-300) INJECTION 61%
INTRAVENOUS | Status: AC
  Administered 2016-09-17: 100 mL
  Filled 2016-09-17: qty 100

## 2016-09-17 MED ORDER — DIPHENHYDRAMINE HCL 12.5 MG/5ML PO ELIX: 12.5000 mg | ORAL_SOLUTION | Freq: Four times a day (QID) | ORAL | Status: DC | PRN

## 2016-09-17 MED ORDER — SODIUM CHLORIDE 0.9 % IV SOLN
INTRAVENOUS | Status: DC
  Administered 2016-09-17 – 2016-09-18 (×2): via INTRAVENOUS

## 2016-09-17 MED ORDER — DIATRIZOATE MEGLUMINE & SODIUM 66-10 % PO SOLN
90.0000 mL | Freq: Once | ORAL | Status: AC
  Administered 2016-09-17: 90 mL via NASOGASTRIC
  Filled 2016-09-17: qty 90

## 2016-09-17 MED ORDER — PANTOPRAZOLE SODIUM 40 MG IV SOLR
40.0000 mg | Freq: Every day | INTRAVENOUS | Status: DC
  Administered 2016-09-17 – 2016-09-18 (×2): 40 mg via INTRAVENOUS
  Filled 2016-09-17 (×2): qty 40

## 2016-09-17 MED ORDER — ONDANSETRON HCL 4 MG/2ML IJ SOLN
4.0000 mg | Freq: Once | INTRAMUSCULAR | Status: AC | PRN
  Administered 2016-09-17: 4 mg via INTRAVENOUS
  Filled 2016-09-17: qty 2

## 2016-09-17 MED ORDER — ACETAMINOPHEN 325 MG PO TABS
650.0000 mg | ORAL_TABLET | Freq: Four times a day (QID) | ORAL | Status: DC | PRN
  Administered 2016-09-18 – 2016-09-19 (×2): 650 mg via ORAL
  Filled 2016-09-17 (×2): qty 2

## 2016-09-17 MED ORDER — MORPHINE SULFATE (PF) 4 MG/ML IV SOLN: 2.0000 mg | INTRAVENOUS | Status: DC | PRN

## 2016-09-17 MED ORDER — SODIUM CHLORIDE 0.9 % IV BOLUS (SEPSIS)
1000.0000 mL | Freq: Once | INTRAVENOUS | Status: AC
  Administered 2016-09-17: 1000 mL via INTRAVENOUS

## 2016-09-17 NOTE — ED Notes (Signed)
General surgery at bedside. 

## 2016-09-17 NOTE — ED Notes (Addendum)
Pt. Refusing NG tube stating that general surgery PA Shanda Bumps(Jessica) told her she only needed one if she was throwing up.

## 2016-09-17 NOTE — ED Triage Notes (Signed)
Pt presents with 6/10 generalized cramping abdominal pain, nausea, and vomiting that started yesterday around 4 pm.  Took Advil around midnight, which helped.  Last episode of emesis was 9:30 pm.

## 2016-09-17 NOTE — ED Notes (Signed)
Patient transported to CT 

## 2016-09-17 NOTE — ED Provider Notes (Signed)
[facility]-EMERGENCY DEPT Provider Note   CSN: [id number] Arrival date & time: 09/17/16  0405     History   Chief Complaint Chief Complaint  Patient presents with  . Abdominal Pain  . Nausea    HPI AZRA ABRELL is a 61 y.o. female.  The history is provided by the patient and medical records.    61 y.o. F with hx of arthritis, migraine headaches, seasonal allergies, presenting to the ED for nausea/vomiting and abdominal pain.  Patient states she ate take out from [employer] at lunch today and began feeling sick shortly after.  States she is not sure if it is the food or mere coincidence.  States throughout the day she has had worsening abdominal pain which she describes as cramping.  States she also has starting to feel bloated and her abdomen is distended.  States she did have a very small BM today, much smaller than normal.  Has had multiple episodes of vomiting since onset, last emesis was 2130.  Does report hx of hysterectomy in 1998.  Also reports hx of SBO previously, states she was told this was secondary to adhesions.  States she was concerned about her symptoms as it did feel somewhat similar to her prior SBO.  Past Medical History:  Diagnosis Date  . Allergy   . Arthritis   . Frequent headaches   . Heart murmur   . Migraine     Patient Active Problem List   Diagnosis Date Noted  . Food allergy 03/17/2016  . Routine general medical examination at a health care facility 11/27/2015  . Obesity 08/21/2015  . GERD (gastroesophageal reflux disease) 08/21/2015  . Allergic rhinitis 08/21/2015    Past Surgical History:  Procedure Laterality Date  . ABDOMINAL HYSTERECTOMY    . intestinal blockage    . TONSILLECTOMY AND ADENOIDECTOMY      OB History    No data available       Home Medications    Prior to Admission medications   Medication Sig Start Date End Date Taking? Authorizing Provider  diphenhydrAMINE (BENADRYL) 25 MG tablet Take 25 mg by mouth as  needed.    Historical Provider, MD  ibuprofen (ADVIL,MOTRIN) 200 MG tablet Take 200 mg by mouth as needed.    Historical Provider, MD    Family History Family History  Problem Relation Age of Onset  . Arthritis Mother   . Heart disease Maternal Grandmother   . Stroke Maternal Grandmother   . Diabetes Maternal Grandmother   . Hypertension Paternal Grandmother   . Cancer Sister     breast  . Cancer Paternal Aunt     breast    Social History Social History  Substance Use Topics  . Smoking status: Former Games developer  . Smokeless tobacco: Never Used  . Alcohol use 1.2 oz/week    2 Standard drinks or equivalent per week     Allergies   Codeine   Review of Systems Review of Systems  Gastrointestinal: Positive for abdominal pain, nausea and vomiting.  All other systems reviewed and are negative.    Physical Exam Updated Vital Signs BP 140/79   Pulse 76   Temp 99.1 F (37.3 C) (Oral)   Resp 16   Ht 5\' 3"  (1.6 m)   Wt 93 kg   SpO2 99%   BMI 36.31 kg/m   Physical Exam  Constitutional: She is oriented to person, place, and time. She appears well-developed and well-nourished.  HENT:  Head: Normocephalic and atraumatic.  Mouth/Throat: Oropharynx is clear and moist.  Eyes: Conjunctivae and EOM are normal. Pupils are equal, round, and reactive to light.  Neck: Normal range of motion.  Cardiovascular: Normal rate, regular rhythm and normal heart sounds.   Pulmonary/Chest: Effort normal and breath sounds normal.  Abdominal: Soft. Bowel sounds are decreased. There is generalized tenderness.  Abdomen soft, does not appear overly distended, decreased bowel sounds, generalized tenderness without rebound or guarding  Musculoskeletal: Normal range of motion.  Neurological: She is alert and oriented to person, place, and time.  Skin: Skin is warm and dry.  Psychiatric: She has a normal mood and affect.  Nursing note and vitals reviewed.    ED Treatments / Results  Labs (all  labs ordered are listed, but only abnormal results are displayed) Labs Reviewed  COMPREHENSIVE METABOLIC PANEL - Abnormal; Notable for the following:       Result Value   Chloride 100 (*)    Glucose, Bld 157 (*)    Creatinine, Ser 1.04 (*)    GFR calc non Af Amer 57 (*)    All other components within normal limits  CBC - Abnormal; Notable for the following:    WBC 10.7 (*)    All other components within normal limits  LIPASE, BLOOD  URINALYSIS, ROUTINE W REFLEX MICROSCOPIC    EKG  EKG Interpretation None       Radiology Ct Abdomen Pelvis W Contrast  Result Date: 09/17/2016 CLINICAL DATA:  Abdominal pain, nausea and vomiting. EXAM: CT ABDOMEN AND PELVIS WITH CONTRAST TECHNIQUE: Multidetector CT imaging of the abdomen and pelvis was performed using the standard protocol following bolus administration of intravenous contrast. CONTRAST:  100mL ISOVUE-300 IOPAMIDOL (ISOVUE-300) INJECTION 61% COMPARISON:  None. FINDINGS: Lower chest: No pulmonary nodules. No visible pleural or pericardial effusion. Hepatobiliary: Normal hepatic size and contours without focal liver lesion. No perihepatic ascites. No intra- or extrahepatic biliary dilatation. Normal gallbladder. Pancreas: Normal pancreatic contours and enhancement. No peripancreatic fluid collection or pancreatic ductal dilatation. Spleen: Normal. Adrenals/Urinary Tract: Normal adrenal glands. No hydronephrosis or solid renal mass. Stomach/Bowel: There are dilated loops of small bowel filled with fluid in the left upper quadrant. There is a transition point within the lower central abdomen (series 201 images 56- 58). There is a small amount of free fluid in the abdomen. Normal appendix. Vascular/Lymphatic: Normal course and caliber of the major abdominal vessels. No abdominal or pelvic adenopathy. Reproductive: Status post hysterectomy.  No adnexal mass. Musculoskeletal: No lytic or blastic osseous lesion. Normal visualized extrathoracic and  extraperitoneal soft tissues. Other: No contributory non-categorized findings. IMPRESSION: Small bowel obstruction with transition point in the lower central abdomen. Electronically Signed   By: Deatra RobinsonKevin  Herman M.D.   On: 09/17/2016 06:24    Procedures Procedures (including critical care time)  Medications Ordered in ED Medications  ondansetron (ZOFRAN) injection 4 mg (4 mg Intravenous Given 09/17/16 0436)  sodium chloride 0.9 % bolus 1,000 mL (0 mLs Intravenous Stopped 09/17/16 0539)  iopamidol (ISOVUE-300) 61 % injection (100 mLs  Contrast Given 09/17/16 0547)     Initial Impression / Assessment and Plan / ED Course  I have reviewed the triage vital signs and the nursing notes.  Pertinent labs & imaging results that were available during my care of the patient were reviewed by me and considered in my medical decision making (see chart for details).  61 year old female here with nausea, vomiting, abdominal pain. Began after eating lunch today. She is afebrile and nontoxic in appearance. Bowel  sounds are somewhat decreased, abdomen is soft with generalized tenderness.  Does have hx of SBO.  Will plan for labs, CT scan.  Given IVF, zofran.  Lab work overall reassuring.  CT scan with evidence of SBO with transition point in central abdomen.  Discussed with general surgery-- morning team will be down to evaluate patient shortly.  Patient updated with care plan, symptoms controlled currently, remains stable.  Final Clinical Impressions(s) / ED Diagnoses   Final diagnoses:  Small bowel obstruction    New Prescriptions New Prescriptions   No medications on file     Garlon Hatchet, Cordelia Poche 09/17/16 0645    Gilda Crease, MD 09/17/16 [phone]

## 2016-09-17 NOTE — H&P (Signed)
[facility] Surgery Consult/Admission Note  Angela Shannon [birth year]  [id number].    Requesting MD: Dr. Betsey Holiday Chief Complaint/Reason for Consult: SBO  HPI:   Patient is a 61 year old female with the past medical history of arthritis, migraine headaches, hysterectomy, small bowel obstruction with exploratory laparotomy in 1983, who presented to the [facility] ED last night with complaints of abdominal cramping nausea and vomiting. Patient states yesterday she ate [employer] and one hour later began feeling sick with nausea and stomach cramps. She had multiple episodes of emesis last night that started around 5 PM and lasted until 9:30 PM. She states her abdominal pain as crampy in nature, constant, nonradiating, nothing made it better, nothing made it worse. She took an ibuprofen without relief. Patient states when she was sitting on the toilet she laid on the bathroom floor and feels like she may have passed out. She is unsure of this. She did not hit her head that she is aware of. Patient had a small normal bowel movement yesterday. Associated cold sweats and abdominal bloating. Patient denies hematemesis, hematochezia, fever, CP, SOB, dysuria, hematuria.  Currently patient is not having abdominal cramping or pain. She is not had any vomiting since last night. She is slightly nauseated. She's not had flatus.  ED course: Labs: Serum creatinine 1.04, WBC 10.7, all other labs are unremarkable CT scan abdomen: Small bowel obstruction with transition point in the lower central abdomen.  ROS:  Review of Systems  Constitutional: Positive for chills and diaphoresis. Negative for fever.  HENT: Negative for congestion and sore throat.   Eyes: Negative for pain and discharge.  Respiratory: Negative for cough and shortness of breath.   Cardiovascular: Negative for chest pain.  Gastrointestinal: Positive for abdominal pain, nausea and vomiting. Negative for blood in stool, constipation and  diarrhea.  Genitourinary: Negative for dysuria and hematuria.  Musculoskeletal: Negative for falls.  Skin: Negative for rash.  Neurological: Negative for dizziness and headaches.  All other systems reviewed and are negative.    Family History  Problem Relation Age of Onset  . Arthritis Mother   . Heart disease Maternal Grandmother   . Stroke Maternal Grandmother   . Diabetes Maternal Grandmother   . Hypertension Paternal Grandmother   . Cancer Sister     breast  . Cancer Paternal Aunt     breast    Past Medical History:  Diagnosis Date  . Allergy   . Arthritis   . Frequent headaches   . Heart murmur   . Migraine     Past Surgical History:  Procedure Laterality Date  . ABDOMINAL HYSTERECTOMY    . intestinal blockage    . TONSILLECTOMY AND ADENOIDECTOMY      Social History:  reports that she has quit smoking. She has never used smokeless tobacco. She reports that she drinks about 1.2 oz of alcohol per week . She reports that she does not use drugs.  Allergies:  Allergies  Allergen Reactions  . Codeine Other (See Comments)    Jittery / Nervous      (Not in a hospital admission)  Blood pressure 122/69, pulse 79, temperature 99.1 F (37.3 C), temperature source Oral, resp. rate 12, height 5' 3"  (1.6 m), weight 205 lb (93 kg), SpO2 95 %.  Physical Exam  Constitutional: She is oriented to person, place, and time and well-developed, well-nourished, and in no distress. Vital signs are normal. No distress.  Well-appearing African-American female  HENT:  Head: Normocephalic and atraumatic.  Nose:  Nose normal.  Mouth/Throat: Oropharynx is clear and moist.  Eyes: Conjunctivae are normal. Pupils are equal, round, and reactive to light. Right eye exhibits no discharge. Left eye exhibits no discharge. No scleral icterus.  Neck: Normal range of motion. Neck supple.  Cardiovascular: Normal rate, regular rhythm, normal heart sounds and intact distal pulses.  Exam reveals no  gallop and no friction rub.   No murmur heard. Pulses:      Radial pulses are 2+ on the right side, and 2+ on the left side.       Dorsalis pedis pulses are 2+ on the right side, and 2+ on the left side.  Pulmonary/Chest: Effort normal and breath sounds normal. No respiratory distress. She has no decreased breath sounds. She has no wheezes. She has no rhonchi. She has no rales.  Abdominal: Soft. Bowel sounds are normal. She exhibits no distension and no mass. There is generalized tenderness (mild). There is no rigidity, no rebound, no guarding and no CVA tenderness.  Well-healed vertical midline scar noted that transverses the umbilicus  Musculoskeletal: Normal range of motion. She exhibits no edema or deformity.  Neurological: She is alert and oriented to person, place, and time.  Skin: Skin is warm and dry. No rash noted. She is not diaphoretic.  Psychiatric: Mood and affect normal.  Nursing note and vitals reviewed.   Results for orders placed or performed during the hospital encounter of 09/17/16 (from the past 48 hour(s))  Lipase, blood     Status: None   Collection Time: 09/17/16  4:29 AM  Result Value Ref Range   Lipase 29 11 - 51 U/L  Comprehensive metabolic panel     Status: Abnormal   Collection Time: 09/17/16  4:29 AM  Result Value Ref Range   Sodium 139 135 - 145 mmol/L   Potassium 4.0 3.5 - 5.1 mmol/L   Chloride 100 (L) 101 - 111 mmol/L   CO2 27 22 - 32 mmol/L   Glucose, Bld 157 (H) 65 - 99 mg/dL   BUN 9 6 - 20 mg/dL   Creatinine, Ser 1.04 (H) 0.44 - 1.00 mg/dL   Calcium 9.9 8.9 - 10.3 mg/dL   Total Protein 7.2 6.5 - 8.1 g/dL   Albumin 4.3 3.5 - 5.0 g/dL   AST 21 15 - 41 U/L   ALT 14 14 - 54 U/L   Alkaline Phosphatase 58 38 - 126 U/L   Total Bilirubin 0.9 0.3 - 1.2 mg/dL   GFR calc non Af Amer 57 (L) >60 mL/min   GFR calc Af Amer >60 >60 mL/min    Comment: (NOTE) The eGFR has been calculated using the CKD EPI equation. This calculation has not been validated in  all clinical situations. eGFR's persistently <60 mL/min signify possible Chronic Kidney Disease.    Anion gap 12 5 - 15  CBC     Status: Abnormal   Collection Time: 09/17/16  4:29 AM  Result Value Ref Range   WBC 10.7 (H) 4.0 - 10.5 K/uL   RBC 5.00 3.87 - 5.11 MIL/uL   Hemoglobin 14.7 12.0 - 15.0 g/dL   HCT 43.9 36.0 - 46.0 %   MCV 87.8 78.0 - 100.0 fL   MCH 29.4 26.0 - 34.0 pg   MCHC 33.5 30.0 - 36.0 g/dL   RDW 14.4 11.5 - 15.5 %   Platelets 195 150 - 400 K/uL   Ct Abdomen Pelvis W Contrast  Result Date: 09/17/2016 CLINICAL DATA:  Abdominal pain, nausea and  vomiting. EXAM: CT ABDOMEN AND PELVIS WITH CONTRAST TECHNIQUE: Multidetector CT imaging of the abdomen and pelvis was performed using the standard protocol following bolus administration of intravenous contrast. CONTRAST:  137m ISOVUE-300 IOPAMIDOL (ISOVUE-300) INJECTION 61% COMPARISON:  None. FINDINGS: Lower chest: No pulmonary nodules. No visible pleural or pericardial effusion. Hepatobiliary: Normal hepatic size and contours without focal liver lesion. No perihepatic ascites. No intra- or extrahepatic biliary dilatation. Normal gallbladder. Pancreas: Normal pancreatic contours and enhancement. No peripancreatic fluid collection or pancreatic ductal dilatation. Spleen: Normal. Adrenals/Urinary Tract: Normal adrenal glands. No hydronephrosis or solid renal mass. Stomach/Bowel: There are dilated loops of small bowel filled with fluid in the left upper quadrant. There is a transition point within the lower central abdomen (series 201 images 56- 58). There is a small amount of free fluid in the abdomen. Normal appendix. Vascular/Lymphatic: Normal course and caliber of the major abdominal vessels. No abdominal or pelvic adenopathy. Reproductive: Status post hysterectomy.  No adnexal mass. Musculoskeletal: No lytic or blastic osseous lesion. Normal visualized extrathoracic and extraperitoneal soft tissues. Other: No contributory non-categorized  findings. IMPRESSION: Small bowel obstruction with transition point in the lower central abdomen. Electronically Signed   By: KUlyses JarredM.D.   On: 09/17/2016 06:24      Assessment/Plan  Small bowel obstruction seen on CT - Patient with a history of small bowel obstruction with lysis of adhesions - Patient mildly tender on exam, no signs of peritonitis - Patient has not had vomiting since last night although she's not having any flatus. Exam less concerning for small bowel obstruction but will still implement the small bowel protocol with administration of Gastrografin and an 8 hour delay abdominal film  FEN: NPO, IV fluids, Protonix BTE: Lovenox, SCDs ID: No antibiotics indicated at this time  Plan: Small bowel protocol in place, admit to our service. Patient not actively vomiting so will not insert NG tube at this time. If patient begins vomiting will need NG tube  Thank you for the consult  JKalman Drape [facility] 09/17/2016, 9:01 AM Pager: [phone]: [phone]-Fri 7:00 am-4:30 pm Sat-Sun 7:00 am-11:30 am

## 2016-09-18 ENCOUNTER — Observation Stay (HOSPITAL_COMMUNITY): Payer: 59

## 2016-09-18 LAB — BASIC METABOLIC PANEL
Anion gap: 3 — ABNORMAL LOW (ref 5–15)
BUN: 8 mg/dL (ref 6–20)
CALCIUM: 8.3 mg/dL — AB (ref 8.9–10.3)
CO2: 29 mmol/L (ref 22–32)
Chloride: 110 mmol/L (ref 101–111)
Creatinine, Ser: 1.05 mg/dL — ABNORMAL HIGH (ref 0.44–1.00)
GFR calc Af Amer: >60 mL/min (ref >60)
GFR, EST NON AFRICAN AMERICAN: 57 mL/min — AB (ref >60)
GLUCOSE: 91 mg/dL (ref 65–99)
Potassium: 3.9 mmol/L (ref 3.5–5.1)
SODIUM: 142 mmol/L (ref 135–145)

## 2016-09-18 LAB — CBC
HCT: 37.4 % (ref 36.0–46.0)
Hemoglobin: 12 g/dL (ref 12.0–15.0)
MCH: 29 pg (ref 26.0–34.0)
MCHC: 32.1 g/dL (ref 30.0–36.0)
MCV: 90.3 fL (ref 78.0–100.0)
PLATELETS: 236 10*3/uL (ref 150–400)
RBC: 4.14 MIL/uL (ref 3.87–5.11)
RDW: 14.7 % (ref 11.5–15.5)
WBC: 5.8 10*3/uL (ref 4.0–10.5)

## 2016-09-18 MED ORDER — OXYCODONE HCL 5 MG PO TABS: 5.0000 mg | ORAL_TABLET | Freq: Four times a day (QID) | ORAL | Status: DC | PRN

## 2016-09-18 MED ORDER — MORPHINE SULFATE (PF) 2 MG/ML IV SOLN: 1.0000 mg | Freq: Four times a day (QID) | INTRAVENOUS | Status: DC | PRN

## 2016-09-18 NOTE — Progress Notes (Signed)
Pt. Tolerating clear liquids fine. NO complaints of nausea/vomiting. MD paged. VErbal order to advance to full liquids.

## 2016-09-18 NOTE — Progress Notes (Signed)
Patient ID: Angela Shannon, female   DOB: 1955-10-02, 61 y.o.   MRN: [id number]  [facility]     Subjective: Feeling better this morning. Abdomen sore but not painful. She is passing flatus and has had bowel movements. XR showed contrast in colon.  Objective: Vital signs in last 24 hours: Temp:  [98.2 F (36.8 C)-98.6 F (37 C)] 98.2 F (36.8 C) (02/23 0528) Pulse Rate:  [59-74] 60 (02/23 0528) Resp:  [8-18] 18 (02/23 0528) BP: (104-121)/(55-67) 112/65 (02/23 0528) SpO2:  [95 %-100 %] 99 % (02/23 0528) Weight:  [210 lb (95.3 kg)] 210 lb (95.3 kg) (02/22 0945) Last BM Date: 09/16/16  Intake/Output from previous day: 02/22 0701 - 02/23 0700 In: 2164.9 [I.V.:2164.9] Out: 1200 [Urine:1200] Intake/Output this shift: No intake/output data recorded.  PE: Gen:  Alert, NAD, pleasant Card:  RRR, no M/G/R heard Pulm:  CTAB, no W/R/R, effort normal Abd: Soft, mild distension, +BS, no HSM, mild tenderness LUQ Ext:  No erythema, edema, or tenderness   Lab Results:   Recent Labs  09/17/16 0921 09/18/16 0453  WBC 10.4 5.8  HGB 12.9 12.0  HCT 38.8 37.4  PLT 285 236   BMET  Recent Labs  09/17/16 0429 09/17/16 0921 09/18/16 0453  NA 139  --  142  K 4.0  --  3.9  CL 100*  --  110  CO2 27  --  29  GLUCOSE 157*  --  91  BUN 9  --  8  CREATININE 1.04* 0.94 1.05*  CALCIUM 9.9  --  8.3*   PT/INR No results for input(s): LABPROT, INR in the last 72 hours. CMP     Component Value Date/Time   NA 142 09/18/2016 0453   K 3.9 09/18/2016 0453   CL 110 09/18/2016 0453   CO2 29 09/18/2016 0453   GLUCOSE 91 09/18/2016 0453   BUN 8 09/18/2016 0453   CREATININE 1.05 (H) 09/18/2016 0453   CALCIUM 8.3 (L) 09/18/2016 0453   PROT 7.2 09/17/2016 0429   ALBUMIN 4.3 09/17/2016 0429   AST 21 09/17/2016 0429   ALT 14 09/17/2016 0429   ALKPHOS 58 09/17/2016 0429   BILITOT 0.9 09/17/2016 0429   GFRNONAA 57 (L) 09/18/2016 0453   GFRAA >60 09/18/2016  0453   Lipase     Component Value Date/Time   LIPASE 29 09/17/2016 0429       Studies/Results: Ct Abdomen Pelvis W Contrast  Result Date: 09/17/2016 CLINICAL DATA:  Abdominal pain, nausea and vomiting. EXAM: CT ABDOMEN AND PELVIS WITH CONTRAST TECHNIQUE: Multidetector CT imaging of the abdomen and pelvis was performed using the standard protocol following bolus administration of intravenous contrast. CONTRAST:  100mL ISOVUE-300 IOPAMIDOL (ISOVUE-300) INJECTION 61% COMPARISON:  None. FINDINGS: Lower chest: No pulmonary nodules. No visible pleural or pericardial effusion. Hepatobiliary: Normal hepatic size and contours without focal liver lesion. No perihepatic ascites. No intra- or extrahepatic biliary dilatation. Normal gallbladder. Pancreas: Normal pancreatic contours and enhancement. No peripancreatic fluid collection or pancreatic ductal dilatation. Spleen: Normal. Adrenals/Urinary Tract: Normal adrenal glands. No hydronephrosis or solid renal mass. Stomach/Bowel: There are dilated loops of small bowel filled with fluid in the left upper quadrant. There is a transition point within the lower central abdomen (series 201 images 56- 58). There is a small amount of free fluid in the abdomen. Normal appendix. Vascular/Lymphatic: Normal course and caliber of the major abdominal vessels. No abdominal or pelvic adenopathy. Reproductive: Status post hysterectomy.  No adnexal mass.  Musculoskeletal: No lytic or blastic osseous lesion. Normal visualized extrathoracic and extraperitoneal soft tissues. Other: No contributory non-categorized findings. IMPRESSION: Small bowel obstruction with transition point in the lower central abdomen. Electronically Signed   By: Deatra Robinson M.D.   On: 09/17/2016 06:24   Dg Abd Portable 1v  Result Date: 09/18/2016 CLINICAL DATA:  61 year old female with small bowel obstruction on 09/17/2016. Status post oral contrast administration. EXAM: PORTABLE ABDOMEN - 1 VIEW  COMPARISON:  CT Abdomen and Pelvis 09/17/2016, and post oral contrast portable abdomen 1844 hours yesterday. FINDINGS: Portable AP supine view at 0655 hours. There is only a small residual volume of contrast in the distal small bowel. The remaining oral contrast is throughout the colon. There are no longer dilated small bowel loops evident in the left mid abdomen. No acute osseous abnormality identified. No definite pneumoperitoneum. IMPRESSION: Evidence of resolved small bowel obstruction, with only a small volume of residual contrast in distal small bowel loops and the remaining oral contrast throughout the large bowel. Electronically Signed   By: Odessa Fleming M.D.   On: 09/18/2016 07:24   Dg Abd Portable 1v-small Bowel Obstruction Protocol-initial, 8 Hr Delay  Result Date: 09/17/2016 CLINICAL DATA:  8 hour delay film for SBO EXAM: PORTABLE ABDOMEN - 1 VIEW COMPARISON:  CT from earlier the same day FINDINGS: There has been progression of the oral contrast material. There are few decompressed distal small bowel loops and residual contrast throughout the nondilated colon. Some contrast fill loops in the lower mid abdomen probably represent dilated small bowel loops seen on prior CT. IMPRESSION: 1. Progression of contrast into distal decompressed small bowel and colon suggesting an incomplete small bowel obstruction. Electronically Signed   By: Corlis Leak M.D.   On: 09/17/2016 19:02    Anti-infectives: Anti-infectives    None       Assessment/Plan SBO - history of small bowel obstruction with lysis of adhesions - CT showed Small bowel obstruction with transition point in the lower central abdomen - XR this AM showed contrast in the colon - bowel function returning  ID - none FEN - IVF, clears VTE - lovenox  Plan - advance to clear liquids. Encourage ambulation. If tolerating clears may advance to full liquids for dinner.   LOS: 0 days    Edson Snowball , [facility]  Surgery 09/18/2016, 8:11 AM Pager: [phone] Consults: [phone] Mon-Fri 7:00 am-4:30 pm Sat-Sun 7:00 am-11:30 am

## 2016-09-19 MED ORDER — PANTOPRAZOLE SODIUM 40 MG PO TBEC: 40.0000 mg | DELAYED_RELEASE_TABLET | Freq: Every day | ORAL | Status: DC

## 2016-09-19 NOTE — Progress Notes (Signed)
[facility] Progress Note     Subjective: Pt not having abdominal pain. She tolerated full liquids. She had a BM yesterday and is having flatus. No nausea or vomiting.   Objective: Vital signs in last 24 hours: Temp:  [98.3 F (36.8 C)-98.7 F (37.1 C)] 98.3 F (36.8 C) (02/24 0522) Pulse Rate:  [58-65] 58 (02/24 0522) Resp:  [16] 16 (02/24 0522) BP: (109-118)/(54-64) 109/54 (02/24 0522) SpO2:  [98 %] 98 % (02/24 0522) Last BM Date: 09/18/16  Intake/Output from previous day: 02/23 0701 - 02/24 0700 In: 3463.8 [P.O.:1230; I.V.:2233.8] Out: 1900 [Urine:1900] Intake/Output this shift: Total I/O In: 360 [P.O.:360] Out: 400 [Urine:400]  PE: Gen:  Alert, NAD, pleasant, cooperative, well appearing Card:  RRR, no M/G/R heard Pulm:  CTA, no W/R/R, effort normal Abd: Soft, nondistended, +BS, very mild TTP to epigastric region, Well-healed vertical midline scar noted that transverses the umbilicus  Skin: no rashes noted, warm and dry, not diaphoretic  Lab Results:   Recent Labs  09/17/16 0921 09/18/16 0453  WBC 10.4 5.8  HGB 12.9 12.0  HCT 38.8 37.4  PLT 285 236   BMET  Recent Labs  09/17/16 0429 09/17/16 0921 09/18/16 0453  NA 139  --  142  K 4.0  --  3.9  CL 100*  --  110  CO2 27  --  29  GLUCOSE 157*  --  91  BUN 9  --  8  CREATININE 1.04* 0.94 1.05*  CALCIUM 9.9  --  8.3*   PT/INR No results for input(s): LABPROT, INR in the last 72 hours. CMP     Component Value Date/Time   NA 142 09/18/2016 0453   K 3.9 09/18/2016 0453   CL 110 09/18/2016 0453   CO2 29 09/18/2016 0453   GLUCOSE 91 09/18/2016 0453   BUN 8 09/18/2016 0453   CREATININE 1.05 (H) 09/18/2016 0453   CALCIUM 8.3 (L) 09/18/2016 0453   PROT 7.2 09/17/2016 0429   ALBUMIN 4.3 09/17/2016 0429   AST 21 09/17/2016 0429   ALT 14 09/17/2016 0429   ALKPHOS 58 09/17/2016 0429   BILITOT 0.9 09/17/2016 0429   GFRNONAA 57 (L) 09/18/2016 0453   GFRAA >60 09/18/2016 0453   Lipase     Component Value Date/Time   LIPASE 29 09/17/2016 0429       Studies/Results: Dg Abd Portable 1v  Result Date: 09/18/2016 CLINICAL DATA:  61 year old female with small bowel obstruction on 09/17/2016. Status post oral contrast administration. EXAM: PORTABLE ABDOMEN - 1 VIEW COMPARISON:  CT Abdomen and Pelvis 09/17/2016, and post oral contrast portable abdomen 1844 hours yesterday. FINDINGS: Portable AP supine view at 0655 hours. There is only a small residual volume of contrast in the distal small bowel. The remaining oral contrast is throughout the colon. There are no longer dilated small bowel loops evident in the left mid abdomen. No acute osseous abnormality identified. No definite pneumoperitoneum. IMPRESSION: Evidence of resolved small bowel obstruction, with only a small volume of residual contrast in distal small bowel loops and the remaining oral contrast throughout the large bowel. Electronically Signed   By: Odessa Fleming M.D.   On: 09/18/2016 07:24   Dg Abd Portable 1v-small Bowel Obstruction Protocol-initial, 8 Hr Delay  Result Date: 09/17/2016 CLINICAL DATA:  8 hour delay film for SBO EXAM: PORTABLE ABDOMEN - 1 VIEW COMPARISON:  CT from earlier the same day FINDINGS: There has been progression of the oral contrast material. There are few decompressed distal  small bowel loops and residual contrast throughout the nondilated colon. Some contrast fill loops in the lower mid abdomen probably represent dilated small bowel loops seen on prior CT. IMPRESSION: 1. Progression of contrast into distal decompressed small bowel and colon suggesting an incomplete small bowel obstruction. Electronically Signed   By: Corlis Leak  Hassell M.D.   On: 09/17/2016 19:02    Anti-infectives: Anti-infectives    None       Assessment/Plan SBO - history of small bowel obstruction with lysis of adhesions - CT showed Small bowel obstruction with transition point in the lower central abdomen - XR showed contrast in  the colon - bowel function returning  ID - none FEN - IVF, soft VTE - lovenox  Plan - advance to soft. Encourage ambulation. If tolerating soft diet may be discharged this afternoon.   LOS: 0 days    Jerre SimonJessica L Jacqualynn Parco , [facility] 09/19/2016, 9:34 AM Pager: [phone] Consults: [phone] Mon-Fri 7:00 am-4:30 pm Sat-Sun 7:00 am-11:30 am

## 2016-09-19 NOTE — Progress Notes (Signed)
D/C papers gone over with pt. NO questions/complaints. No prescriptions to give to pt. Pt. D/c'd successfully. Refused w/c.

## 2016-09-19 NOTE — Discharge Instructions (Signed)
Small Bowel Obstruction °A small bowel obstruction is a blockage in the small bowel. The small bowel, which is also called the small intestine, is a long, slender tube that connects the stomach to the colon. When a person eats and drinks, food and fluids go from the stomach to the small bowel. This is where most of the nutrients in the food and fluids are absorbed. °A small bowel obstruction will prevent food and fluids from passing through the small bowel as they normally do during digestion. The small bowel can become partially or completely blocked. This can cause symptoms such as abdominal pain, vomiting, and bloating. If this condition is not treated, it can be dangerous because the small bowel could rupture. °What are the causes? °Common causes of this condition include: °· Scar tissue from previous surgery or radiation treatment. °· Recent surgery. This may cause the movements of the bowel to slow down and cause food to block the intestine. °· Hernias. °· Inflammatory bowel disease (colitis). °· Twisting of the bowel (volvulus). °· Tumors. °· A foreign body. °· Slipping of a part of the bowel into another part (intussusception). °What are the signs or symptoms? °Symptoms of this condition include: °· Abdominal pain. This may be dull cramps or sharp pain. It may occur in one area, or it may be present in the entire abdomen. Pain can range from mild to severe, depending on the degree of obstruction. °· Nausea and vomiting. Vomit may be greenish or a yellow bile color. °· Abdominal bloating. °· Constipation. °· Lack of passing gas. °· Frequent belching. °· Diarrhea. This may occur if the obstruction is partial and runny stool is able to leak around the obstruction. °How is this diagnosed? °This condition may be diagnosed based on a physical exam, medical history, and X-rays of the abdomen. You may also have other tests, such as a CT scan of the abdomen and pelvis. °How is this treated? °Treatment for this  condition depends on the cause and severity of the problem. Treatment options may include: °· Bed rest along with fluids and pain medicines that are given through an IV tube inserted into one of your veins. Sometimes, this is all that is needed for the obstruction to improve. °· Following a simple diet. In some cases, a clear liquid diet may be required for several days. This allows the bowel to rest. °· Placement of a small tube (nasogastric tube) into the stomach. When the bowel is blocked, it usually swells up like a balloon that is filled with air and fluids. The air and fluids may be removed by suction through the nasogastric tube. This can help with pain, discomfort, and nausea. It can also help the obstruction to clear up faster. °· Surgery. This may be required if other treatments do not work. Bowel obstruction from a hernia may require early surgery and can be an emergency procedure. Surgery may also be required for scar tissue that causes frequent or severe obstructions. °Follow these instructions at home: °· Get plenty of rest. °· Follow instructions from your health care provider about eating restrictions. You may need to avoid solid foods and consume only clear liquids until your condition improves. °· Take over-the-counter and prescription medicines only as told by your health care provider. °· Keep all follow-up visits as told by your health care provider. This is important. °Contact a health care provider if: °· You have a fever. °· You have chills. °Get help right away if: °· You have increased   pain or cramping. °· You vomit blood. °· You have uncontrolled vomiting or nausea. °· You cannot drink fluids because of vomiting or pain. °· You develop confusion. °· You begin feeling very dry or thirsty (dehydrated). °· You have severe bloating. °· You feel extremely weak or you faint. °This information is not intended to replace advice given to you by your health care provider. Make sure you discuss any  questions you have with your health care provider. °Document Released: 09/29/2005 Document Revised: 03/09/2016 Document Reviewed: 09/06/2014 °[employer] Interactive Patient Education © 2017 [employer] ° °

## 2016-09-19 NOTE — Discharge Summary (Signed)
[facility] Discharge Summary   Patient ID: Angela Shannon MRN: [id number] DOB/AGE: 02-Aug-1955 61 y.o.  Admit date: 09/17/2016 Discharge date: 09/19/2016  Admitting Diagnosis: Small Bowel Obstruction  Discharge Diagnosis Patient Active Problem List   Diagnosis Date Noted  . Small bowel obstruction 09/17/2016  . Food allergy 03/17/2016  . Routine general medical examination at a health care facility 11/27/2015  . Obesity 08/21/2015  . GERD (gastroesophageal reflux disease) 08/21/2015  . Allergic rhinitis 08/21/2015    Consultants None   Imaging: Dg Abd Portable 1v  Result Date: 09/18/2016 CLINICAL DATA:  61 year old female with small bowel obstruction on 09/17/2016. Status post oral contrast administration. EXAM: PORTABLE ABDOMEN - 1 VIEW COMPARISON:  CT Abdomen and Pelvis 09/17/2016, and post oral contrast portable abdomen 1844 hours yesterday. FINDINGS: Portable AP supine view at 0655 hours. There is only a small residual volume of contrast in the distal small bowel. The remaining oral contrast is throughout the colon. There are no longer dilated small bowel loops evident in the left mid abdomen. No acute osseous abnormality identified. No definite pneumoperitoneum. IMPRESSION: Evidence of resolved small bowel obstruction, with only a small volume of residual contrast in distal small bowel loops and the remaining oral contrast throughout the large bowel. Electronically Signed   By: Angela Shannon M.D.   On: 09/18/2016 07:24   Dg Abd Portable 1v-small Bowel Obstruction Protocol-initial, 8 Hr Delay  Result Date: 09/17/2016 CLINICAL DATA:  8 hour delay film for SBO EXAM: PORTABLE ABDOMEN - 1 VIEW COMPARISON:  CT from earlier the same day FINDINGS: There has been progression of the oral contrast material. There are few decompressed distal small bowel loops and residual contrast throughout the nondilated colon. Some contrast fill loops in the lower mid abdomen probably  represent dilated small bowel loops seen on prior CT. IMPRESSION: 1. Progression of contrast into distal decompressed small bowel and colon suggesting an incomplete small bowel obstruction. Electronically Signed   By: Angela Shannon M.D.   On: 09/17/2016 19:02    Procedures None  Hospital Course:  Angela Shannon is a 61 year old female with history of arthritis, migraine headaches, hysterectomy, small bowel obstruction with exploratory laparotomy in 1983,  who presented to [facility] with abdominal pain, nausea and vomiting.  Workup showed SBO.  Patient was admitted and small bowel protocol was put into place. The pt was not actively vomiting so an NG tube was not placed. The following day the abdominal xray showed contrast in the colon. Pt was advanced to clear liquids. Diet was advanced as tolerated.  On hospital day #2, the patient was voiding well, having flatus, tolerating diet, ambulating well, pain well controlled, vital signs stable, and felt stable for discharge home.  Patient will follow up in our office in 2 weeks with Dr. Lindie Shannon and knows to call with questions or concerns.  She will call to schedule an appointment date/time.    Patient was discharged in good condition.  The [place] Substance controlled database was reviewed prior to prescribing narcotic pain medication to this patient.   Allergies as of 09/19/2016      Reactions   Codeine Other (See Comments)   Jittery / Nervous       Medication List    TAKE these medications   BIOTIN PO Take 1 tablet by mouth daily.   diphenhydrAMINE 25 MG tablet Commonly known as:  BENADRYL Take 25 mg by mouth as needed for allergies or sleep.   ibuprofen 200  MG tablet Commonly known as:  ADVIL,MOTRIN Take 200 mg by mouth as needed for mild pain.        Follow-up Information    Jimmye NormanJAMES WYATT, MD. Schedule an appointment as soon as possible for a visit in 2 week(s).   Specialty:  General Surgery Why:  for follow up  Contact  information: [address] [place] [address] [phone]           Signed: Joyce CopaJessica L [facility] 09/19/2016, 9:37 AM Pager: [phone] Consults: [phone] Mon-Fri 7:00 am-4:30 pm Sat-Sun 7:00 am-11:30 am

## 2016-09-19 NOTE — Progress Notes (Signed)
Pt. Tolerated soft diet. It has been about 1 hour since she finished eating and she has no complaints of nausea/vomiting. Pt. States she ate almost all of her food. PA paged.

## 2016-10-30 ENCOUNTER — Ambulatory Visit (INDEPENDENT_AMBULATORY_CARE_PROVIDER_SITE_OTHER): Payer: 59 | Admitting: Internal Medicine

## 2016-10-30 ENCOUNTER — Encounter: Payer: Self-pay | Admitting: Internal Medicine

## 2016-10-30 DIAGNOSIS — N644 Mastodynia: Secondary | ICD-10-CM | POA: Insufficient documentation

## 2016-10-30 NOTE — Patient Instructions (Signed)
The breasts are normal today so nothing that should not be there.

## 2016-10-30 NOTE — Progress Notes (Signed)
   Subjective:    Patient ID: Angela Shannon, female    DOB: 28-Feb-1956, 61 y.o.   MRN: [id number]  HPI The patient is a 61 YO female coming in for evaluation of some left breast pain and small lump under her nipple. She noticed it about 1 week ago. She woke in the middle of the night with pain and felt a small lump. No skin changes or discharge. The pain is now gone and the lump is not really there much but she was concerned. She just had mammogram in December which was normal.   Review of Systems  Constitutional: Negative for activity change, appetite change, diaphoresis, fatigue, fever and unexpected weight change.  HENT: Negative.   Eyes: Negative.   Respiratory: Negative for cough, chest tightness, shortness of breath and wheezing.   Cardiovascular: Negative.   Gastrointestinal: Negative.   Musculoskeletal: Positive for myalgias.      Objective:   Physical Exam  Constitutional: She is oriented to person, place, and time. She appears well-developed and well-nourished.  HENT:  Head: Normocephalic and atraumatic.  Eyes: EOM are normal.  Neck: Normal range of motion.  Cardiovascular: Normal rate and regular rhythm.   Pulmonary/Chest: Effort normal and breath sounds normal. No respiratory distress. She has no wheezes. She has no rales. She exhibits no tenderness.  Clinical breast exam normal, no skin changes, no lymph nodes axillary, no lumps or abnormal findings.   Abdominal: Soft.  Neurological: She is alert and oriented to person, place, and time.  Skin: Skin is warm and dry.   Vitals:   10/30/16 0844  BP: 130/80  Pulse: 73  Resp: 14  Temp: 98.2 F (36.8 C)  TempSrc: Oral  SpO2: 99%  Weight: 210 lb (95.3 kg)  Height:  (1.6 m)      Assessment & Plan:

## 2016-10-30 NOTE — Assessment & Plan Note (Signed)
No pain or lumps found on exam. No skin changes. Mammogram normal in December. Symptoms are resolving and advised to call back if worsening or recurrence.

## 2016-10-30 NOTE — Progress Notes (Signed)
Pre visit review using our clinic review tool, if applicable. No additional management support is needed unless otherwise documented below in the visit note. 

## 2017-01-05 ENCOUNTER — Ambulatory Visit (HOSPITAL_COMMUNITY)
Admission: EM | Admit: 2017-01-05 | Discharge: 2017-01-05 | Disposition: A | Discharge status: Home / Self Care | Payer: 59 | Attending: Internal Medicine | Admitting: Internal Medicine

## 2017-01-05 ENCOUNTER — Encounter (HOSPITAL_COMMUNITY): Payer: Self-pay | Admitting: Emergency Medicine

## 2017-01-05 ENCOUNTER — Ambulatory Visit: Payer: 59 | Admitting: Nurse Practitioner

## 2017-01-05 DIAGNOSIS — S39012A Strain of muscle, fascia and tendon of lower back, initial encounter: Secondary | ICD-10-CM | POA: Diagnosis not present

## 2017-01-05 MED ORDER — METHOCARBAMOL 500 MG PO TABS: 500.0000 mg | ORAL_TABLET | Freq: Two times a day (BID) | ORAL | 0 refills | Status: DC

## 2017-01-05 MED ORDER — DICLOFENAC SODIUM 75 MG PO TBEC: 75.0000 mg | DELAYED_RELEASE_TABLET | Freq: Two times a day (BID) | ORAL | 0 refills | Status: DC

## 2017-01-05 NOTE — Discharge Instructions (Signed)
You most likely have a strained muscle or spasm in your back. I have prescribed two medicines for your pain. The first is diclofenac, take 1 tablet twice a day and the other is Robaxin, take 1 tablet twice a day. Robaxin may cause drowsiness so do not drive until you know how this medicine affects you. Also do not drink any alcohol either. You may apply ice and alternate with heat for 15 minutes at a time 4 times daily and for additional pain control you may take tylenol over the counter ever 4 hours but do not take more than 4000 mg a day. Should your pain continue or fail to resolve, follow up with your primary care provider or return to clinic as needed.

## 2017-01-05 NOTE — ED Triage Notes (Signed)
[facility] a [facility] upper/middle back pain x 2 weeks. The patient denied any known injury.

## 2017-01-05 NOTE — ED Provider Notes (Signed)
CSN: [id number]     Arrival date & time 01/05/17  1650 History   First MD Initiated Contact with Patient 01/05/17 1705     Chief Complaint  Patient presents with  . Back Pain   (Consider location/radiation/quality/duration/timing/severity/associated sxs/prior Treatment) The history is provided by the patient.  Back Pain  Location:  Thoracic spine Quality:  Aching, cramping and stiffness Stiffness is present:  All day Radiates to:  Does not radiate Pain severity:  Moderate Pain is:  Same all the time Onset quality:  Gradual Duration:  1 week Timing:  Constant Progression:  Worsening Chronicity:  New Context: not emotional stress, not falling, not lifting heavy objects, not MCA, not MVA and not twisting   Relieved by:  Being still Worsened by:  Bending, movement and twisting Associated symptoms: no bladder incontinence, no bowel incontinence, no fever, no headaches, no numbness, no paresthesias, no tingling and no weakness     Past Medical History:  Diagnosis Date  . Anemia   . Arthritis    "lower back; right 2nd toe" (09/17/2016)  . Family history of adverse reaction to anesthesia    "mom got combative"  . Frequent headaches    "seasonal" (09/17/2016)  . GERD (gastroesophageal reflux disease)   . Heart murmur    "was told I did; can't always find it" (09/17/2016)  . Migraine    "maybe 1/year" (09/17/2016)  . Pneumonia ~ 2005  . Refusal of blood transfusions as patient is Jehov[employer]'s Witness   . Seasonal allergies   . Small bowel obstruction (HCC) 1983; 09/17/2016   Past Surgical History:  Procedure Laterality Date  . ABDOMINAL HYSTERECTOMY    . APPENDECTOMY    . CARPAL TUNNEL RELEASE Left   . DILATION AND CURETTAGE OF UTERUS    . EXPLORATORY LAPAROTOMY  1983   small bowel obstruction Angela Shannon 09/17/2016  . TONSILLECTOMY AND ADENOIDECTOMY     Family History  Problem Relation Age of Onset  . Arthritis Mother   . Heart disease Maternal Grandmother   . Stroke Maternal  Grandmother   . Diabetes Maternal Grandmother   . Hypertension Paternal Grandmother   . Cancer Sister        breast  . Cancer Paternal Aunt        breast   Social History  Substance Use Topics  . Smoking status: Former Smoker    Packs/day: 1.00    Years: 7.00    Types: Cigarettes    Quit date: 1984  . Smokeless tobacco: Never Used  . Alcohol use 1.2 oz/week    2 Standard drinks or equivalent per week     Comment: 09/17/2016 "couple glasses of wine/month; if  that"   OB History    No data available     Review of Systems  Constitutional: Negative for chills and fever.  HENT: Negative.   Respiratory: Negative.   Cardiovascular: Negative.   Gastrointestinal: Negative.  Negative for bowel incontinence.  Genitourinary: Negative for bladder incontinence.  Musculoskeletal: Positive for back pain. Negative for neck pain and neck stiffness.  Skin: Negative.   Neurological: Negative for tingling, weakness, numbness, headaches and paresthesias.    Allergies  Codeine  Home Medications   Prior to Admission medications   Medication Sig Start Date End Date Taking? Authorizing Provider  diclofenac (VOLTAREN) 75 MG EC tablet Take 1 tablet (75 mg total) by mouth 2 (two) times daily. 01/05/17   Dorena Bodo, NP  methocarbamol (ROBAXIN) 500 MG tablet Take 1 tablet (500 mg  total) by mouth 2 (two) times daily. 01/05/17   Dorena BodoKennard, Lazarius Rivkin, NP   Meds Ordered and Administered this Visit  Medications - No data to display  BP 110/80 (BP Location: Right Arm)   Pulse 77   Temp 97.9 F (36.6 C) (Oral)   Resp 18   SpO2 100%  No data found.   Physical Exam  Constitutional: She is oriented to person, place, and time. She appears well-developed and well-nourished. No distress.  HENT:  Head: Normocephalic and atraumatic.  Right Ear: External ear normal.  Left Ear: External ear normal.  Cardiovascular: Normal rate and regular rhythm.   Pulmonary/Chest: Effort normal and breath sounds  normal.  Musculoskeletal:       Thoracic back: She exhibits tenderness and pain. She exhibits no deformity.  Neurological: She is alert and oriented to person, place, and time.  Skin: Skin is warm and dry. Capillary refill takes less than 2 seconds. No rash noted. She is not diaphoretic. No erythema.  Psychiatric: She has a normal mood and affect. Her behavior is normal.  Nursing note and vitals reviewed.   Urgent Care Course     Procedures (including critical care time)  Labs Review Labs Reviewed - No data to display  Imaging Review No results found.      MDM   1. Strain of lumbar region, initial encounter    Given Rx for diclofenac, Robaxin, counseling symptom management, follow-up with primary care as needed.    Dorena BodoKennard, Aalaysia Liggins, NP 01/05/17 1754

## 2017-03-02 ENCOUNTER — Encounter: Payer: Self-pay | Admitting: Gastroenterology

## 2017-03-02 ENCOUNTER — Other Ambulatory Visit (INDEPENDENT_AMBULATORY_CARE_PROVIDER_SITE_OTHER): Payer: 59

## 2017-03-02 ENCOUNTER — Ambulatory Visit (INDEPENDENT_AMBULATORY_CARE_PROVIDER_SITE_OTHER): Payer: 59 | Admitting: Nurse Practitioner

## 2017-03-02 ENCOUNTER — Encounter: Payer: Self-pay | Admitting: Nurse Practitioner

## 2017-03-02 VITALS — BP 110/74 | HR 66 | Temp 98.2°F | Ht 63.0 in | Wt 208.0 lb

## 2017-03-02 DIAGNOSIS — N644 Mastodynia: Secondary | ICD-10-CM | POA: Diagnosis not present

## 2017-03-02 DIAGNOSIS — Z136 Encounter for screening for cardiovascular disorders: Secondary | ICD-10-CM

## 2017-03-02 DIAGNOSIS — Z0001 Encounter for general adult medical examination with abnormal findings: Secondary | ICD-10-CM | POA: Diagnosis not present

## 2017-03-02 DIAGNOSIS — Z1211 Encounter for screening for malignant neoplasm of colon: Secondary | ICD-10-CM

## 2017-03-02 DIAGNOSIS — Z13220 Encounter for screening for lipoid disorders: Secondary | ICD-10-CM | POA: Diagnosis not present

## 2017-03-02 DIAGNOSIS — R200 Anesthesia of skin: Secondary | ICD-10-CM | POA: Diagnosis not present

## 2017-03-02 LAB — LIPID PANEL
CHOL/HDL RATIO: 3
CHOLESTEROL: 153 mg/dL (ref 0–200)
HDL: 56.4 mg/dL (ref >39.00)
LDL Cholesterol: 77 mg/dL (ref 0–99)
NonHDL: 97.03
TRIGLYCERIDES: 99 mg/dL (ref 0.0–149.0)
VLDL: 19.8 mg/dL (ref 0.0–40.0)

## 2017-03-02 LAB — TSH: TSH: 1.02 u[IU]/mL (ref 0.35–4.50)

## 2017-03-02 LAB — VITAMIN B12: Vitamin B-12: 240 pg/mL (ref 211–911)

## 2017-03-02 NOTE — Patient Instructions (Signed)
Go to basement for blood draw. You will be call with lab results, to schedule mammogram, and appt with GI.  Consider referral to neurology for finger numbness if labs are normal.  Health Maintenance, Female Adopting a healthy lifestyle and getting preventive care can go a long way to promote health and wellness. Talk with your health care provider about what schedule of regular examinations is right for you. This is a good chance for you to check in with your provider about disease prevention and staying healthy. In between checkups, there are plenty of things you can do on your own. Experts have done a lot of research about which lifestyle changes and preventive measures are most likely to keep you healthy. Ask your health care provider for more information. Weight and diet Eat a healthy diet  Be sure to include plenty of vegetables, fruits, low-fat dairy products, and lean protein.  Do not eat a lot of foods high in solid fats, added sugars, or salt.  Get regular exercise. This is one of the most important things you can do for your health. ? Most adults should exercise for at least 150 minutes each week. The exercise should increase your heart rate and make you sweat (moderate-intensity exercise). ? Most adults should also do strengthening exercises at least twice a week. This is in addition to the moderate-intensity exercise.  Maintain a healthy weight  Body mass index (BMI) is a measurement that can be used to identify possible weight problems. It estimates body fat based on height and weight. Your health care provider can help determine your BMI and help you achieve or maintain a healthy weight.  For females 72 years of age and older: ? A BMI below 18.5 is considered underweight. ? A BMI of 18.5 to 24.9 is normal. ? A BMI of 25 to 29.9 is considered overweight. ? A BMI of 30 and above is considered obese.  Watch levels of cholesterol and blood lipids  You should start having your  blood tested for lipids and cholesterol at 60 years of age, then have this test every 5 years.  You may need to have your cholesterol levels checked more often if: ? Your lipid or cholesterol levels are high. ? You are older than 60 years of age. ? You are at high risk for heart disease.  Cancer screening Lung Cancer  Lung cancer screening is recommended for adults 22-59 years old who are at high risk for lung cancer because of a history of smoking.  A yearly low-dose CT scan of the lungs is recommended for people who: ? Currently smoke. ? Have quit within the past 15 years. ? Have at least a 30-pack-year history of smoking. A pack year is smoking an average of one pack of cigarettes a day for 1 year.  Yearly screening should continue until it has been 15 years since you quit.  Yearly screening should stop if you develop a health problem that would prevent you from having lung cancer treatment.  Breast Cancer  Practice breast self-awareness. This means understanding how your breasts normally appear and feel.  It also means doing regular breast self-exams. Let your health care provider know about any changes, no matter how small.  If you are in your 20s or 30s, you should have a clinical breast exam (CBE) by a health care provider every 1-3 years as part of a regular health exam.  If you are 29 or older, have a CBE every year. Also consider  having a breast X-ray (mammogram) every year.  If you have a family history of breast cancer, talk to your health care provider about genetic screening.  If you are at high risk for breast cancer, talk to your health care provider about having an MRI and a mammogram every year.  Breast cancer gene (BRCA) assessment is recommended for women who have family members with BRCA-related cancers. BRCA-related cancers include: ? Breast. ? Ovarian. ? Tubal. ? Peritoneal cancers.  Results of the assessment will determine the need for genetic  counseling and BRCA1 and BRCA2 testing.  Cervical Cancer Your health care provider may recommend that you be screened regularly for cancer of the pelvic organs (ovaries, uterus, and vagina). This screening involves a pelvic examination, including checking for microscopic changes to the surface of your cervix (Pap test). You may be encouraged to have this screening done every 3 years, beginning at age 91.  For women ages 52-65, health care providers may recommend pelvic exams and Pap testing every 3 years, or they may recommend the Pap and pelvic exam, combined with testing for human papilloma virus (HPV), every 5 years. Some types of HPV increase your risk of cervical cancer. Testing for HPV may also be done on women of any age with unclear Pap test results.  Other health care providers may not recommend any screening for nonpregnant women who are considered low risk for pelvic cancer and who do not have symptoms. Ask your health care provider if a screening pelvic exam is right for you.  If you have had past treatment for cervical cancer or a condition that could lead to cancer, you need Pap tests and screening for cancer for at least 20 years after your treatment. If Pap tests have been discontinued, your risk factors (such as having a new sexual partner) need to be reassessed to determine if screening should resume. Some women have medical problems that increase the chance of getting cervical cancer. In these cases, your health care provider may recommend more frequent screening and Pap tests.  Colorectal Cancer  This type of cancer can be detected and often prevented.  Routine colorectal cancer screening usually begins at 60 years of age and continues through 60 years of age.  Your health care provider may recommend screening at an earlier age if you have risk factors for colon cancer.  Your health care provider may also recommend using home test kits to check for hidden blood in the  stool.  A small camera at the end of a tube can be used to examine your colon directly (sigmoidoscopy or colonoscopy). This is done to check for the earliest forms of colorectal cancer.  Routine screening usually begins at age 21.  Direct examination of the colon should be repeated every 5-10 years through 61 years of age. However, you may need to be screened more often if early forms of precancerous polyps or small growths are found.  Skin Cancer  Check your skin from head to toe regularly.  Tell your health care provider about any new moles or changes in moles, especially if there is a change in a mole's shape or color.  Also tell your health care provider if you have a mole that is larger than the size of a pencil eraser.  Always use sunscreen. Apply sunscreen liberally and repeatedly throughout the day.  Protect yourself by wearing long sleeves, pants, a wide-brimmed hat, and sunglasses whenever you are outside.  Heart disease, diabetes, and high blood pressure  High blood pressure causes heart disease and increases the risk of stroke. High blood pressure is more likely to develop in: ? People who have blood pressure in the high end of the normal range (130-139/85-89 mm Hg). ? People who are overweight or obese. ? People who are African American.  If you are 14-51 years of age, have your blood pressure checked every 3-5 years. If you are 50 years of age or older, have your blood pressure checked every year. You should have your blood pressure measured twice-once when you are at a hospital or clinic, and once when you are not at a hospital or clinic. Record the average of the two measurements. To check your blood pressure when you are not at a hospital or clinic, you can use: ? An automated blood pressure machine at a pharmacy. ? A home blood pressure monitor.  If you are between 6 years and 19 years old, ask your health care provider if you should take aspirin to prevent  strokes.  Have regular diabetes screenings. This involves taking a blood sample to check your fasting blood sugar level. ? If you are at a normal weight and have a low risk for diabetes, have this test once every three years after 60 years of age. ? If you are overweight and have a high risk for diabetes, consider being tested at a younger age or more often. Preventing infection Hepatitis B  If you have a higher risk for hepatitis B, you should be screened for this virus. You are considered at high risk for hepatitis B if: ? You were born in a country where hepatitis B is common. Ask your health care provider which countries are considered high risk. ? Your parents were born in a high-risk country, and you have not been immunized against hepatitis B (hepatitis B vaccine). ? You have HIV or AIDS. ? You use needles to inject street drugs. ? You live with someone who has hepatitis B. ? You have had sex with someone who has hepatitis B. ? You get hemodialysis treatment. ? You take certain medicines for conditions, including cancer, organ transplantation, and autoimmune conditions.  Hepatitis C  Blood testing is recommended for: ? Everyone born from [birth year] through 1964. ? Anyone with known risk factors for hepatitis C.  Sexually transmitted infections (STIs)  You should be screened for sexually transmitted infections (STIs) including gonorrhea and chlamydia if: ? You are sexually active and are younger than 60 years of age. ? You are older than 60 years of age and your health care provider tells you that you are at risk for this type of infection. ? Your sexual activity has changed since you were last screened and you are at an increased risk for chlamydia or gonorrhea. Ask your health care provider if you are at risk.  If you do not have HIV, but are at risk, it may be recommended that you take a prescription medicine daily to prevent HIV infection. This is called pre-exposure prophylaxis  (PrEP). You are considered at risk if: ? You are sexually active and do not regularly use condoms or know the HIV status of your partner(s). ? You take drugs by injection. ? You are sexually active with a partner who has HIV.  Talk with your health care provider about whether you are at high risk of being infected with HIV. If you choose to begin PrEP, you should first be tested for HIV. You should then be tested every 3 months  for as long as you are taking PrEP. Pregnancy  If you are premenopausal and you may become pregnant, ask your health care provider about preconception counseling.  If you may become pregnant, take 400 to 800 micrograms (mcg) of folic acid every day.  If you want to prevent pregnancy, talk to your health care provider about birth control (contraception). Osteoporosis and menopause  Osteoporosis is a disease in which the bones lose minerals and strength with aging. This can result in serious bone fractures. Your risk for osteoporosis can be identified using a bone density scan.  If you are 42 years of age or older, or if you are at risk for osteoporosis and fractures, ask your health care provider if you should be screened.  Ask your health care provider whether you should take a calcium or vitamin D supplement to lower your risk for osteoporosis.  Menopause may have certain physical symptoms and risks.  Hormone replacement therapy may reduce some of these symptoms and risks. Talk to your health care provider about whether hormone replacement therapy is right for you. Follow these instructions at home:  Schedule regular health, dental, and eye exams.  Stay current with your immunizations.  Do not use any tobacco products including cigarettes, chewing tobacco, or electronic cigarettes.  If you are pregnant, do not drink alcohol.  If you are breastfeeding, limit how much and how often you drink alcohol.  Limit alcohol intake to no more than 1 drink per day for  nonpregnant women. One drink equals 12 ounces of beer, 5 ounces of wine, or 1 ounces of hard liquor.  Do not use street drugs.  Do not share needles.  Ask your health care provider for help if you need support or information about quitting drugs.  Tell your health care provider if you often feel depressed.  Tell your health care provider if you have ever been abused or do not feel safe at home. This information is not intended to replace advice given to you by your health care provider. Make sure you discuss any questions you have with your health care provider. Document Released: 01/26/2011 Document Revised: 12/19/2015 Document Reviewed: 04/16/2015 Elsevier Interactive Patient Education  [employer].

## 2017-03-02 NOTE — Progress Notes (Signed)
Subjective:    Patient ID: Angela Shannon, female    DOB: February 23, 1956, 61 y.o.   MRN: [id number]  Patient presents today for complete physical  HPI  Left fingers numbness: Onset 2weeks ago. Worse in tip of left pointer finger. No injury. Worse with extension of fingers. s/p left carpal tunnel surgery and ganglion cyst removal  Left breast pain: Onset 10/2016 Increased sensitivity and sharp pain, intermittent. No mass noted, no skin or nipple changes. No known trigger. Consumes 2cups of coffee (no change) FH of breast cancer.  GERD: Controlled with diet.  Immunizations: (TDAP, Hep C screen, Pneumovax, Influenza, zoster)  Health Maintenance  Topic Date Due  . Tetanus Vaccine  08/08/1975  . Colon Cancer Screening  08/07/2006  . Flu Shot  02/24/2017  . Mammogram  07/23/2018  .  Hepatitis C: One time screening is recommended by Center for Disease Control  (CDC) for  adults born from [birth year] through 1964.   Completed  . HIV Screening  Completed   Diet:regular.  Weight:  Wt Readings from Last 3 Encounters:  03/02/17 208 lb (94.3 kg)  10/30/16 210 lb (95.3 kg)  09/17/16 210 lb (95.3 kg)   Exercise: occassional yoga.  Fall Risk: Fall Risk  03/02/2017  Falls in the past year? No   Home Safety:home alone.  Depression/Suicide: Depression screen [facility] 2/9 03/02/2017  Decreased Interest 0  Down, Depressed, Hopeless 0  PHQ - 2 Score 0   No flowsheet data found. Colonoscopy (every 5-24yrs, >50-14yrs):needed  Vision:needed, will schedule.  Dental:up to date, done every 6months.  Advanced Directive: Advanced Directives 09/17/2016  Does Patient Have a Medical Advance Directive? No  Would patient like information on creating a medical advance directive? No - Patient declined    Medications and allergies reviewed with patient and updated if appropriate.  Patient Active Problem List   Diagnosis Date Noted  . Numbness of finger 03/02/2017  . Breast pain, left  10/30/2016  . Small bowel obstruction (HCC) 09/17/2016  . Food allergy 03/17/2016  . Routine general medical examination at a health care facility 11/27/2015  . Obesity 08/21/2015  . GERD (gastroesophageal reflux disease) 08/21/2015  . Allergic rhinitis 08/21/2015    No current outpatient prescriptions on file prior to visit.   No current facility-administered medications on file prior to visit.     Past Medical History:  Diagnosis Date  . Anemia   . Arthritis    "lower back; right 2nd toe" (09/17/2016)  . Family history of adverse reaction to anesthesia    "mom got combative"  . Frequent headaches    "seasonal" (09/17/2016)  . GERD (gastroesophageal reflux disease)   . Heart murmur    "was told I did; can't always find it" (09/17/2016)  . Migraine    "maybe 1/year" (09/17/2016)  . Pneumonia ~ 2005  . Refusal of blood transfusions as patient is Jehovah's Witness   . Seasonal allergies   . Small bowel obstruction (HCC) 1983; 09/17/2016    Past Surgical History:  Procedure Laterality Date  . ABDOMINAL HYSTERECTOMY    . APPENDECTOMY    . CARPAL TUNNEL RELEASE Left   . DILATION AND CURETTAGE OF UTERUS    . EXPLORATORY LAPAROTOMY  1983   small bowel obstruction Hattie Perch 09/17/2016  . TONSILLECTOMY AND ADENOIDECTOMY      Social History   Social History  . Marital status: Divorced    Spouse name: N/A  . Number of children: N/A  . Years of education: N/A  Social History Main Topics  . Smoking status: Former Smoker    Packs/day: 1.00    Years: 7.00    Types: Cigarettes    Quit date: 1984  . Smokeless tobacco: Never Used  . Alcohol use 1.2 oz/week    2 Standard drinks or equivalent per week     Comment: 09/17/2016 "couple glasses of wine/month; if  that"  . Drug use: No  . Sexual activity: Not Currently   Other Topics Concern  . None   Social History Narrative  . None    Family History  Problem Relation Age of Onset  . Arthritis Mother   . Heart disease  Maternal Grandmother   . Stroke Maternal Grandmother   . Diabetes Maternal Grandmother   . Hypertension Paternal Grandmother   . Cancer Sister        breast  . Cancer Paternal Aunt        breast        Review of Systems  Constitutional: Negative for fever, malaise/fatigue and weight loss.  HENT: Negative for congestion and sore throat.   Eyes:       Negative for visual changes  Respiratory: Negative for cough and shortness of breath.   Cardiovascular: Negative for chest pain, palpitations and leg swelling.  Gastrointestinal: Negative for blood in stool, constipation, diarrhea and heartburn.  Genitourinary: Negative for dysuria, frequency and urgency.  Musculoskeletal: Positive for joint pain. Negative for falls and myalgias.       Left breast pain  Skin: Negative for rash.  Neurological: Negative for dizziness, sensory change and headaches.  Endo/Heme/Allergies: Does not bruise/bleed easily.  Psychiatric/Behavioral: Negative for depression, substance abuse and suicidal ideas. The patient is not nervous/anxious.     Objective:   Vitals:   03/02/17 0824  BP: 110/74  Pulse: 66  Temp: 98.2 F (36.8 C)    Body mass index is 36.85 kg/m.   Physical Examination:  Physical Exam  Constitutional: She is oriented to person, place, and time and well-developed, well-nourished, and in no distress. No distress.  HENT:  Right Ear: External ear normal.  Left Ear: External ear normal.  Nose: Nose normal.  Mouth/Throat: Oropharynx is clear and moist. No oropharyngeal exudate.  Eyes: Pupils are equal, round, and reactive to light. Conjunctivae and EOM are normal. No scleral icterus.  Neck: Normal range of motion. Neck supple. No thyromegaly present.  Cardiovascular: Normal rate, regular rhythm, normal heart sounds and intact distal pulses.   Pulmonary/Chest: Effort normal and breath sounds normal. She exhibits tenderness. She exhibits no mass and no bony tenderness. Right breast  exhibits no inverted nipple, no mass, no nipple discharge, no skin change and no tenderness. Left breast exhibits tenderness. Left breast exhibits no inverted nipple, no mass, no nipple discharge and no skin change. Breasts are symmetrical.    Abdominal: Soft. Bowel sounds are normal. She exhibits no distension. There is no tenderness.  Genitourinary:  Genitourinary Comments: Pelvic exam deferred by patient  Musculoskeletal: Normal range of motion. She exhibits no edema, tenderness or deformity.  Lymphadenopathy:    She has no cervical adenopathy.  Neurological: She is alert and oriented to person, place, and time. She has normal reflexes. No cranial nerve deficit. Gait normal. Coordination normal.  Normal and equal microfilament sensation in UE. Normal distal pulses.  Skin: Skin is warm and dry.  Psychiatric: Affect and judgment normal.  Vitals reviewed.   ASSESSMENT and PLAN:  Harvel QualeJacquelyn was seen today for annual exam.  Diagnoses and  all orders for this visit:  Encounter for preventative adult health care exam with abnormal findings -     Comprehensive metabolic panel -     TSH; Future -     Lipid panel; Future -     Ambulatory referral to Gastroenterology  Breast pain, left -     MM Digital Diagnostic Unilat L; Future -     US BREAST LTD UNI LEFT INC AXILLA; Future  Numbness of finger -     B12; Future -     Ambulatory referral to Neurology  Colon cancer screening -     Ambulatory referral to Gastroenterology  Encounter for lipid screening for cardiovascular disease -     Lipid panel; Future    No problem-specific Assessment & Plan notes found for this encounter.  Recent Results (from the past 2160 hour(s))  TSH     Status: None   Collection Time: 03/02/17  9:26 AM  Result Value Ref Range   TSH 1.02 0.35 - 4.50 uIU/mL  Lipid panel     Status: None   Collection Time: 03/02/17  9:26 AM  Result Value Ref Range   Cholesterol 153 0 - 200 mg/dL    Comment: ATP III  Classification       Desirable:  < 200 mg/dL               Borderline High:  200 - 239 mg/dL          High:  > = 161 mg/dL   Triglycerides 09.6 0.0 - 149.0 mg/dL    Comment: Normal:  <045 mg/dLBorderline High:  150 - 199 mg/dL   HDL 40.98 >11.91 mg/dL   VLDL 47.8 0.0 - 29.5 mg/dL   LDL Cholesterol 77 0 - 99 mg/dL   Total CHOL/HDL Ratio 3     Comment:                Men          Women1/2 Average Risk     3.4          3.3Average Risk          5.0          4.42X Average Risk          9.6          7.13X Average Risk          15.0          11.0                       NonHDL 97.03     Comment: NOTE:  Non-HDL goal should be 30 mg/dL higher than patient's LDL goal (i.e. LDL goal of < 70 mg/dL, would have non-HDL goal of < 100 mg/dL)  A21     Status: None   Collection Time: 03/02/17  9:26 AM  Result Value Ref Range   Vitamin B-12 240 211 - 911 pg/mL       Follow up: Return in about 1 year (around 03/02/2018) for with Dr. Okey Dupre.  Alysia Penna, NP

## 2017-03-15 ENCOUNTER — Encounter: Payer: Self-pay | Admitting: Neurology

## 2017-03-22 ENCOUNTER — Other Ambulatory Visit: Payer: Self-pay | Admitting: Nurse Practitioner

## 2017-03-22 ENCOUNTER — Ambulatory Visit
Admission: RE | Admit: 2017-03-22 | Discharge: 2017-03-22 | Disposition: A | Discharge status: Home / Self Care | Payer: 59 | Source: Ambulatory Visit | Attending: Nurse Practitioner | Admitting: Nurse Practitioner

## 2017-03-22 DIAGNOSIS — N644 Mastodynia: Secondary | ICD-10-CM

## 2017-03-22 DIAGNOSIS — N6320 Unspecified lump in the left breast, unspecified quadrant: Secondary | ICD-10-CM

## 2017-03-22 DIAGNOSIS — N6002 Solitary cyst of left breast: Secondary | ICD-10-CM

## 2017-03-23 ENCOUNTER — Other Ambulatory Visit: Payer: Self-pay | Admitting: Nurse Practitioner

## 2017-03-23 DIAGNOSIS — N6320 Unspecified lump in the left breast, unspecified quadrant: Secondary | ICD-10-CM

## 2017-03-24 ENCOUNTER — Ambulatory Visit
Admission: RE | Admit: 2017-03-24 | Discharge: 2017-03-24 | Disposition: A | Discharge status: Home / Self Care | Payer: 59 | Source: Ambulatory Visit | Attending: Nurse Practitioner | Admitting: Nurse Practitioner

## 2017-03-24 DIAGNOSIS — N6002 Solitary cyst of left breast: Secondary | ICD-10-CM

## 2017-03-24 DIAGNOSIS — N6320 Unspecified lump in the left breast, unspecified quadrant: Secondary | ICD-10-CM

## 2017-03-25 IMAGING — CT CT ABD-PELV W/ CM
2 of 5 series · 10 of 46 positions shown, 11 images · IV contrast (Iodine)
Comparison: None.

CLINICAL DATA: Abdominal pain, nausea and vomiting.

EXAM:
CT ABDOMEN AND PELVIS WITH CONTRAST
TECHNIQUE: Multidetector CT imaging of the abdomen and pelvis was performed
using the standard protocol following bolus administration of
intravenous contrast.
CONTRAST:  100mL 2WO1OL-VJJ IOPAMIDOL (2WO1OL-VJJ) INJECTION 61%

[Series 201: routine, idose (2) · axial · 0.85mm/px · z∈[-616,-251]mm · 7 of 93 slices shown, 8 images]
[im 10/93  soft-tissue]
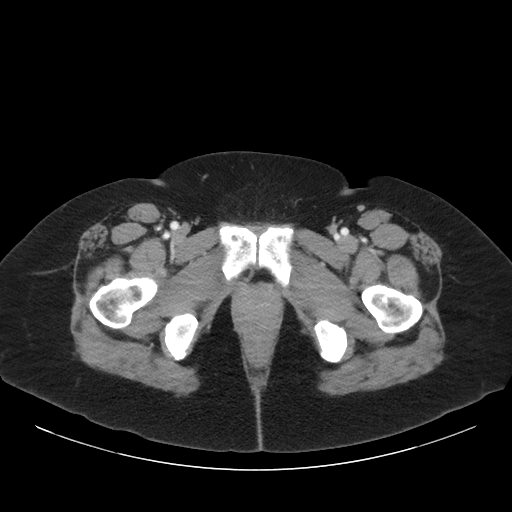
[im 10/93  bone]
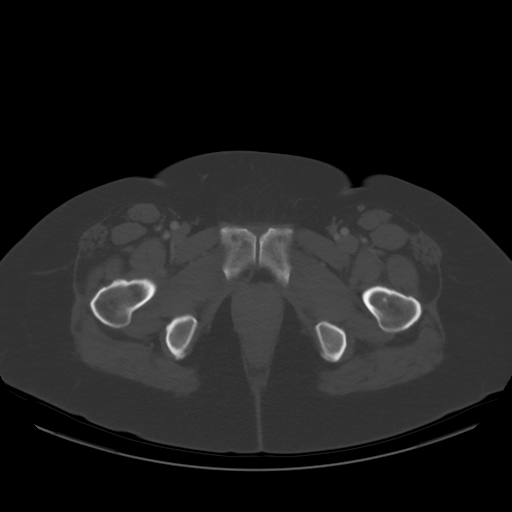
[im 24/93  soft-tissue]
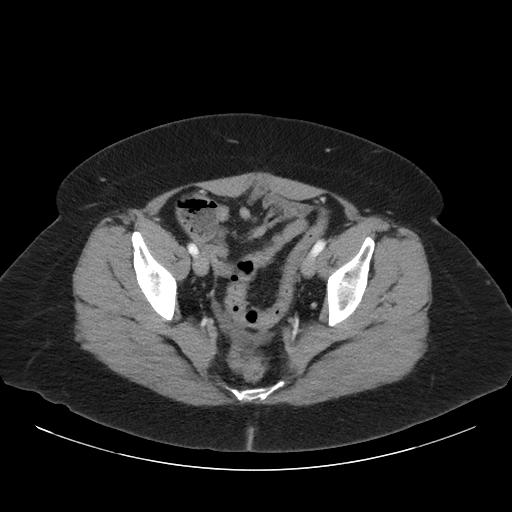
[im 33/93  soft-tissue]
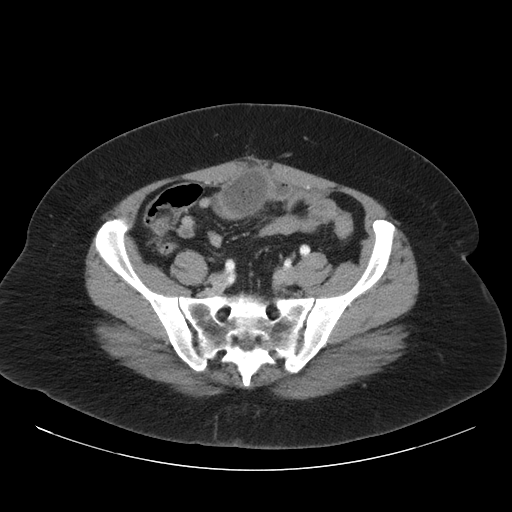
[im 47/93  soft-tissue]
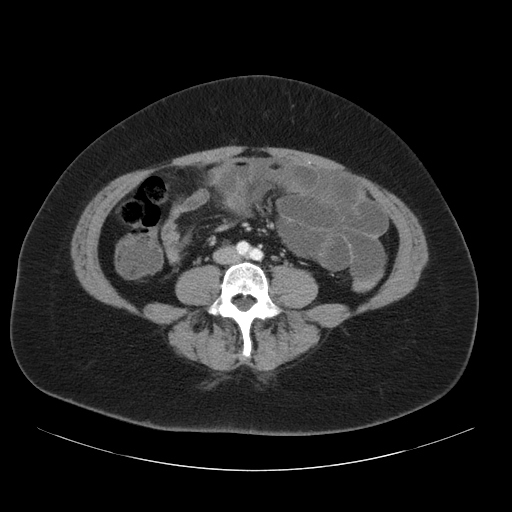
[im 60/93  soft-tissue]
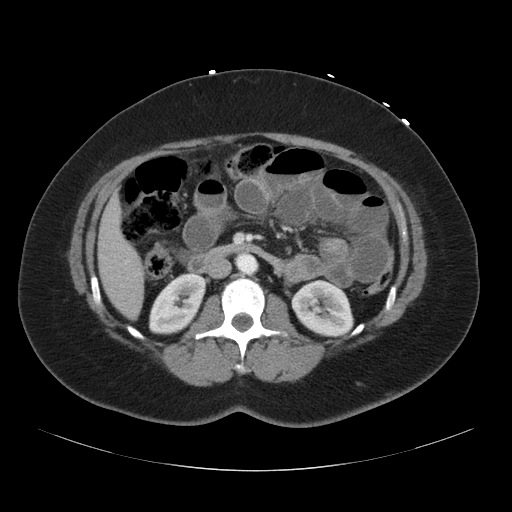
[im 70/93  soft-tissue]
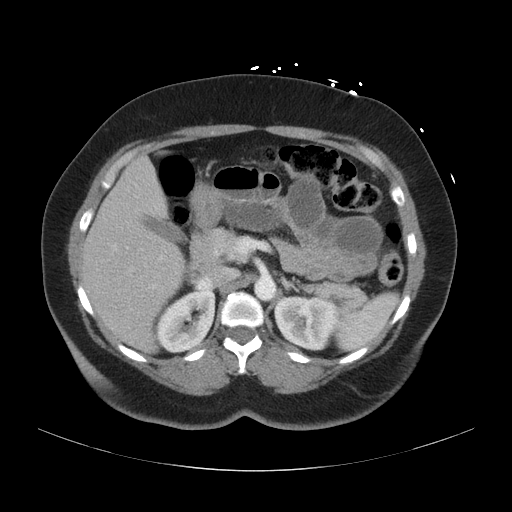
[im 83/93  soft-tissue]
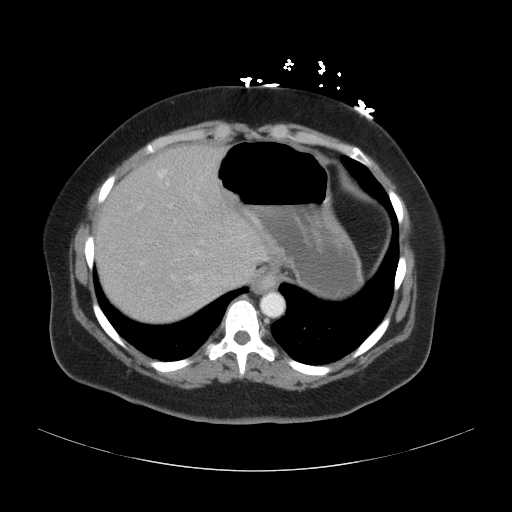

[Series 203: coronals, idose (2) · coronal · 0.45mm/px · 3 of 115 slices shown]
[im 39/115  soft-tissue]
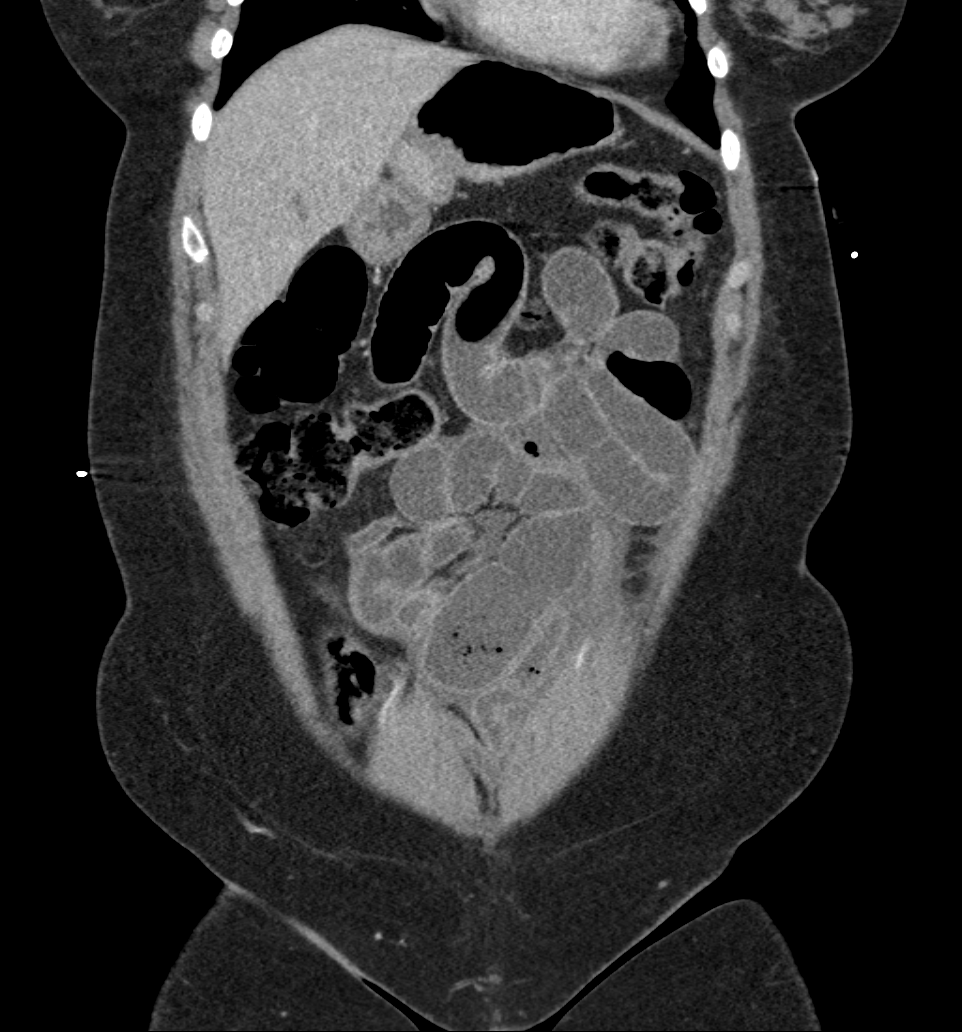
[im 51/115  soft-tissue]
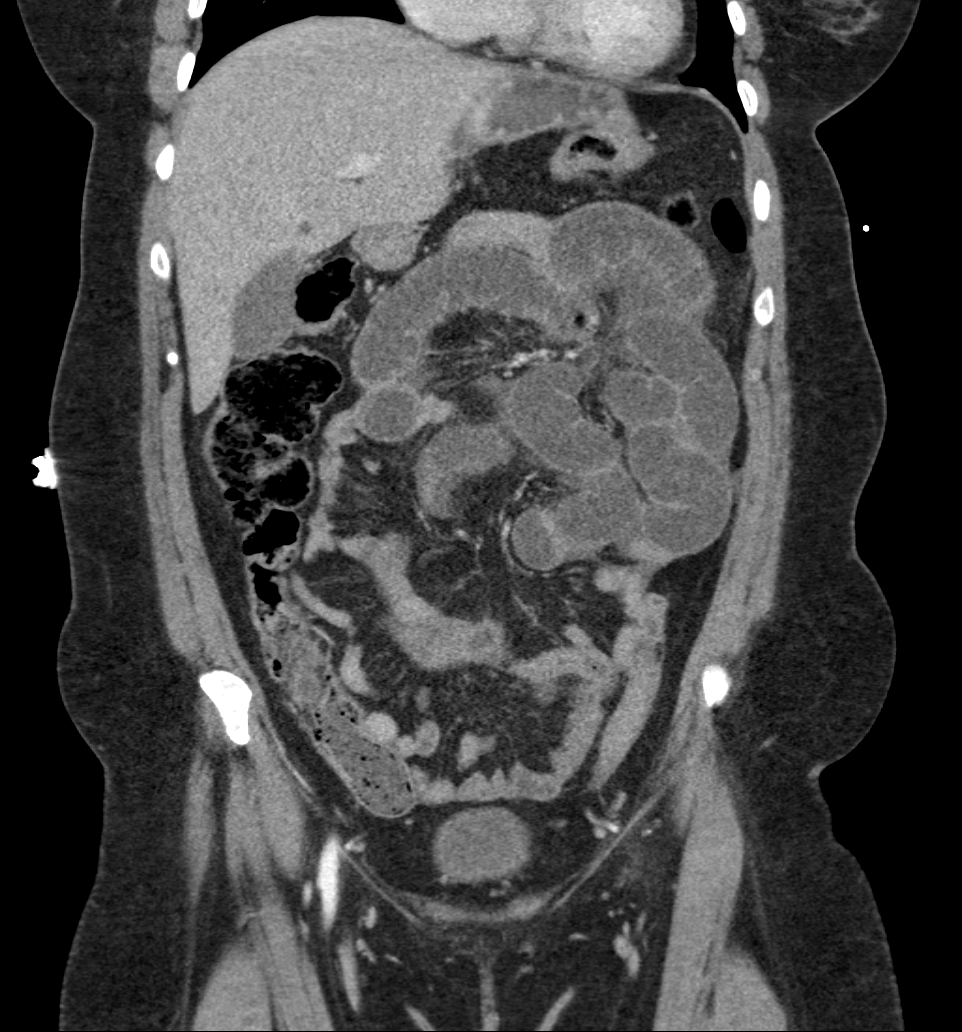
[im 64/115  soft-tissue]
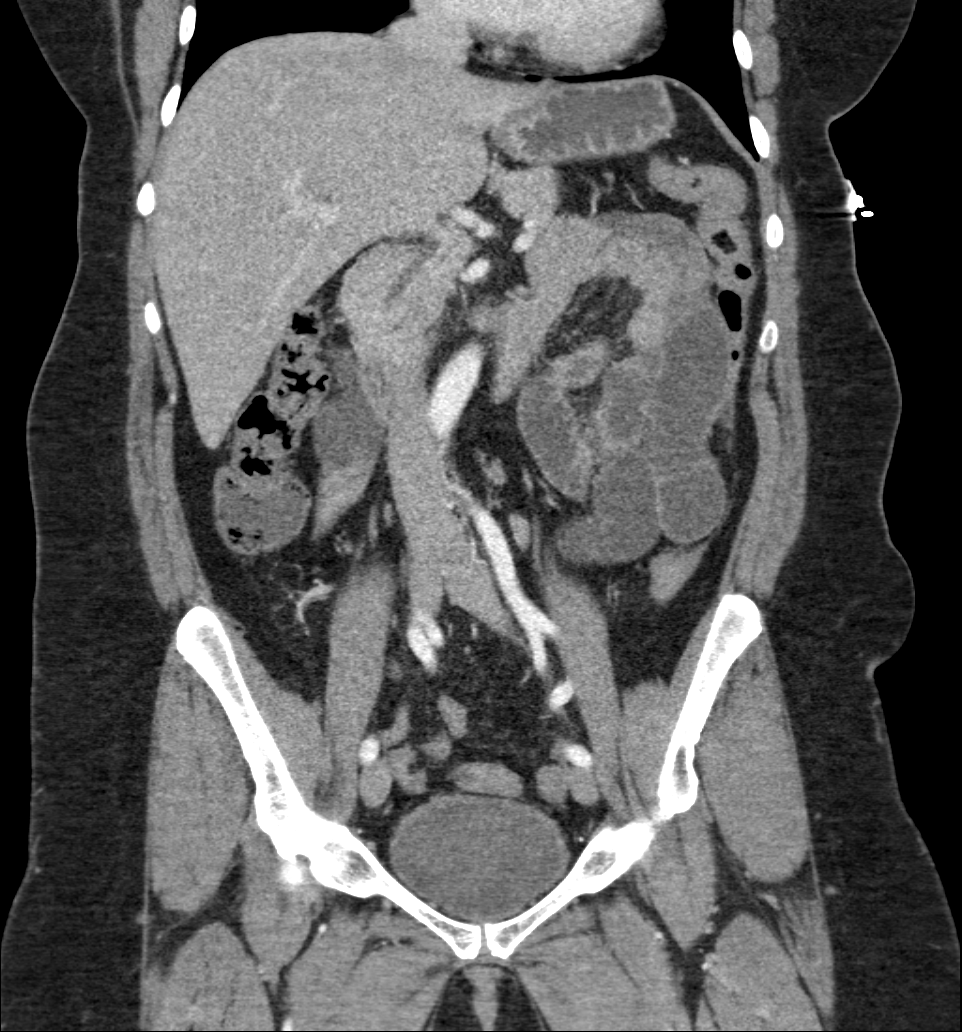

[10 of 46 positions shown; findings below may reference images not displayed]

FINDINGS: Lower chest: No pulmonary nodules. No visible pleural or pericardial
effusion.

Hepatobiliary: Normal hepatic size and contours without focal liver
lesion. No perihepatic ascites. No intra- or extrahepatic biliary
dilatation. Normal gallbladder.

Pancreas: Normal pancreatic contours and enhancement. No
peripancreatic fluid collection or pancreatic ductal dilatation.

Spleen: Normal.

Adrenals/Urinary Tract: Normal adrenal glands. No hydronephrosis or
solid renal mass.

Stomach/Bowel: There are dilated loops of small bowel filled with
fluid in the left upper quadrant. There is a transition point within
the lower central abdomen (series 201 images 56- 58). There is a
small amount of free fluid in the abdomen. Normal appendix.

Vascular/Lymphatic: Normal course and caliber of the major abdominal
vessels. No abdominal or pelvic adenopathy.

Reproductive: Status post hysterectomy.  No adnexal mass.

Musculoskeletal: No lytic or blastic osseous lesion. Normal
visualized extrathoracic and extraperitoneal soft tissues.

Other: No contributory non-categorized findings.
IMPRESSION: Small bowel obstruction with transition point in the lower central
abdomen.

## 2017-04-13 ENCOUNTER — Ambulatory Visit (AMBULATORY_SURGERY_CENTER): Payer: Self-pay | Admitting: *Deleted

## 2017-04-13 VITALS — Ht 63.5 in | Wt 208.4 lb

## 2017-04-13 DIAGNOSIS — Z1211 Encounter for screening for malignant neoplasm of colon: Secondary | ICD-10-CM

## 2017-04-13 MED ORDER — NA SULFATE-K SULFATE-MG SULF 17.5-3.13-1.6 GM/177ML PO SOLN: ORAL | 0 refills | Status: DC

## 2017-04-13 NOTE — Progress Notes (Signed)
No allergies to eggs or soy. No problems with anesthesia.  Pt given Emmi instructions for colonoscopy  No oxygen use  No diet drug use  

## 2017-04-14 ENCOUNTER — Emergency Department (HOSPITAL_COMMUNITY): Payer: Worker's Compensation

## 2017-04-14 ENCOUNTER — Encounter (HOSPITAL_COMMUNITY): Payer: Self-pay | Admitting: Emergency Medicine

## 2017-04-14 DIAGNOSIS — Y9289 Other specified places as the place of occurrence of the external cause: Secondary | ICD-10-CM | POA: Insufficient documentation

## 2017-04-14 DIAGNOSIS — S0181XA Laceration without foreign body of other part of head, initial encounter: Secondary | ICD-10-CM | POA: Insufficient documentation

## 2017-04-14 DIAGNOSIS — Z87891 Personal history of nicotine dependence: Secondary | ICD-10-CM | POA: Insufficient documentation

## 2017-04-14 DIAGNOSIS — W500XXA Accidental hit or strike by another person, initial encounter: Secondary | ICD-10-CM | POA: Diagnosis not present

## 2017-04-14 DIAGNOSIS — S93401A Sprain of unspecified ligament of right ankle, initial encounter: Secondary | ICD-10-CM | POA: Diagnosis not present

## 2017-04-14 DIAGNOSIS — Y9389 Activity, other specified: Secondary | ICD-10-CM | POA: Insufficient documentation

## 2017-04-14 DIAGNOSIS — Y990 Civilian activity done for income or pay: Secondary | ICD-10-CM | POA: Diagnosis not present

## 2017-04-14 DIAGNOSIS — Z23 Encounter for immunization: Secondary | ICD-10-CM | POA: Insufficient documentation

## 2017-04-14 DIAGNOSIS — S0990XA Unspecified injury of head, initial encounter: Secondary | ICD-10-CM | POA: Diagnosis present

## 2017-04-14 NOTE — ED Triage Notes (Signed)
Patient tripped and fell this evening and injured her right ankle with small laceration at left lateral eye ( bleeding controlled) / swelling and bruise at left eye. Denies LOC , alert and oriented.

## 2017-04-15 ENCOUNTER — Encounter: Payer: Self-pay | Admitting: Gastroenterology

## 2017-04-15 ENCOUNTER — Emergency Department (HOSPITAL_COMMUNITY)
Admission: EM | Admit: 2017-04-15 | Discharge: 2017-04-15 | Disposition: A | Discharge status: Home / Self Care | Payer: Worker's Compensation | Attending: Emergency Medicine | Admitting: Emergency Medicine

## 2017-04-15 DIAGNOSIS — S0181XA Laceration without foreign body of other part of head, initial encounter: Secondary | ICD-10-CM

## 2017-04-15 DIAGNOSIS — S93401A Sprain of unspecified ligament of right ankle, initial encounter: Secondary | ICD-10-CM

## 2017-04-15 DIAGNOSIS — S0990XA Unspecified injury of head, initial encounter: Secondary | ICD-10-CM

## 2017-04-15 MED ORDER — TETANUS-DIPHTH-ACELL PERTUSSIS 5-2.5-18.5 LF-MCG/0.5 IM SUSP
0.5000 mL | Freq: Once | INTRAMUSCULAR | Status: AC
  Administered 2017-04-15: 0.5 mL via INTRAMUSCULAR
  Filled 2017-04-15: qty 0.5

## 2017-04-15 MED ORDER — IBUPROFEN 800 MG PO TABS
800.0000 mg | ORAL_TABLET | Freq: Once | ORAL | Status: AC
  Administered 2017-04-15: 800 mg via ORAL
  Filled 2017-04-15: qty 1

## 2017-04-15 NOTE — ED Provider Notes (Signed)
TIME SEEN: 1:10 AM  CHIEF COMPLAINT: Fall  HPI: Patient is a 61 year old female with no significant past medical history who presents to the emergency department with a fall. States she was at work and was trying to help someone he was about to fall and when she caught the person they knocked her to the ground. She did hit the left side of her face on the ground. She has a small laceration just underneath the left eyebrow. There is no loss of consciousness. She is not on antiplatelets or anticoagulants. States with this fall she also twisted her right ankle. She has been able to ambulate. Denies neck or back pain. No chest or abdominal pain. No numbness, tingling or focal weakness. Unsure of her last tetanus vaccination.  ROS: See HPI Constitutional: no fever  Eyes: no drainage  ENT: no runny nose   Cardiovascular:  no chest pain  Resp: no SOB  GI: no vomiting GU: no dysuria Integumentary: no rash  Allergy: no hives  Musculoskeletal: no leg swelling  Neurological: no slurred speech ROS otherwise negative  PAST MEDICAL HISTORY/PAST SURGICAL HISTORY:  Past Medical History:  Diagnosis Date  . Allergy    food   . Anemia   . Arthritis    "lower back; right 2nd toe" (09/17/2016)  . Family history of adverse reaction to anesthesia    "mom got combative"  . Frequent headaches    "seasonal" (09/17/2016)  . GERD (gastroesophageal reflux disease)   . Heart murmur    "was told I did; can't always find it" (09/17/2016)  . Migraine    "maybe 1/year" (09/17/2016)  . Pneumonia ~ 2005  . Refusal of blood transfusions as patient is Jehovah's Witness   . Seasonal allergies   . Small bowel obstruction ([place]) 1983; 09/17/2016    MEDICATIONS:  Prior to Admission medications   Medication Sig Start Date End Date Taking? Authorizing Provider  Cyanocobalamin (B-12 PO) Take by mouth daily.    [provider]  Na Sulfate-K Sulfate-Mg Sulf (SUPREP BOWEL PREP KIT) 17.5-3.13-1.6 GM/180ML SOLN  suprep as directed.  No substitutions 04/13/17   Doran Stabler, MD    ALLERGIES:  Allergies  Allergen Reactions  . Codeine Other (See Comments)    Jittery / Nervous   . Robaxin [Methocarbamol] Other (See Comments)    Numbness in arm  . Latex Rash  . Sunscreens Rash    SOCIAL HISTORY:  Social History  Substance Use Topics  . Smoking status: Former Smoker    Packs/day: 1.00    Years: 7.00    Types: Cigarettes    Quit date: 1984  . Smokeless tobacco: Never Used  . Alcohol use 1.2 oz/week    2 Standard drinks or equivalent per week     Comment: 09/17/2016 "couple glasses of wine/month; if  that"    FAMILY HISTORY: Family History  Problem Relation Age of Onset  . Arthritis Mother   . Heart disease Maternal Grandmother   . Stroke Maternal Grandmother   . Diabetes Maternal Grandmother   . Hypertension Paternal Grandmother   . Cancer Sister        breast  . Breast cancer Sister 68  . Cancer Paternal Aunt        breast  . Breast cancer Paternal Aunt        onset in her 50s  . Breast cancer Paternal Aunt   . Colon cancer Paternal Elenor Legato        is half aunt  EXAM: BP 136/89   Pulse (!) 56   Temp 98.7 F (37.1 C) (Oral)   Resp 15   Ht 5' 3"  (1.6 m)   Wt 94.3 kg (208 lb)   SpO2 100%   BMI 36.85 kg/m  CONSTITUTIONAL: Alert and oriented and responds appropriately to questions. Well-appearing; well-nourished; GCS 15 HEAD: Normocephalic; 2 cm superficial laceration just underneath the lateral aspect of the left eyebrow with small amount of soft tissue swelling EYES: Conjunctivae clear, PERRL, EOMI ENT: normal nose; no rhinorrhea; moist mucous membranes; pharynx without lesions noted; no dental injury; no septal hematoma NECK: Supple, no meningismus, no LAD; no midline spinal tenderness, step-off or deformity; trachea midline CARD: RRR; S1 and S2 appreciated; no murmurs, no clicks, no rubs, no gallops RESP: Normal chest excursion without splinting or tachypnea;  breath sounds clear and equal bilaterally; no wheezes, no rhonchi, no rales; no hypoxia or respiratory distress CHEST:  chest wall stable, no crepitus or ecchymosis or deformity, nontender to palpation; no flail chest ABD/GI: Normal bowel sounds; non-distended; soft, non-tender, no rebound, no guarding; no ecchymosis or other lesions noted PELVIS:  stable, nontender to palpation BACK:  The back appears normal and is non-tender to palpation, there is no CVA tenderness; no midline spinal tenderness, step-off or deformity EXT: Patient has swelling and ecchymosis noted to the lateral right ankle but there is no ligamentous laxity and no bony deformity. No tenderness at the right proximal fibular head. 2+ DP pulses bilaterally. Normal ROM in all joints; otherwise extremities are non-tender to palpation; no edema; normal capillary refill; no cyanosis, compartments are soft, extremities are warm and well-perfused, no ecchymosis SKIN: Normal color for age and race; warm NEURO: Moves all extremities equally, normal sensation diffusely, cranial nerves II through XII intact, normal speech PSYCH: The patient's mood and manner are appropriate. Grooming and personal hygiene are appropriate.  MEDICAL DECISION MAKING: Patient here with mechanical fall. CT of her head and x-ray of her right ankle show no acute abnormality. Tetanus vaccination has been updated. We have given her ibuprofen for pain and applied an Ace wrap for her right ankle sprain. Her crutches but she feels comfortable with walking. I have repaired her wound using Dermabond. Discussed head injury return precautions. Recommended alternating Tylenol and Motrin at home. Recommended rest, elevation and ice. Patient comfortable with this plan to be discharged.  At this time, I do not feel there is any life-threatening condition present. I have reviewed and discussed all results (EKG, imaging, lab, urine as appropriate) and exam findings with patient/family. I  have reviewed nursing notes and appropriate previous records.  I feel the patient is safe to be discharged home without further emergent workup and can continue workup as an outpatient as needed. Discussed usual and customary return precautions. Patient/family verbalize understanding and are comfortable with this plan.  Outpatient follow-up has been provided if needed. All questions have been answered.    Marland Kitchen.Laceration Repair Date/Time: 04/15/2017 2:05 AM Performed by: Nyra Jabs Authorized by: Nyra Jabs   Consent:    Consent obtained:  Verbal   Consent given by:  Patient   Risks discussed:  Infection, poor wound healing and poor cosmetic result   Alternatives discussed:  No treatment Anesthesia (see MAR for exact dosages):    Anesthesia method:  None Laceration details:    Location:  Face   Face location:  L eyebrow   Length (cm):  2   Depth (mm):  1 Pre-procedure details:  Preparation:  Patient was prepped and draped in usual sterile fashion Exploration:    Wound exploration: wound explored through full range of motion     Wound extent: no foreign bodies/material noted, no nerve damage noted, no underlying fracture noted and no vascular damage noted     Contaminated: no   Treatment:    Area cleansed with:  Soap and water   Amount of cleaning:  Standard Skin repair:    Repair method:  Tissue adhesive Approximation:    Approximation:  Close   Vermilion border: well-aligned   Post-procedure details:    Dressing:  Open (no dressing)   Patient tolerance of procedure:  Tolerated well, no immediate complications        Lexton Hidalgo, Delice Bison, DO 04/15/17 [id number]

## 2017-04-15 NOTE — Discharge Instructions (Signed)
You may alternate Tylenol 1000 mg every 6 hours as needed for pain and Ibuprofen 800 mg every 8 hours as needed for pain.  Please take Ibuprofen with food. ° °

## 2017-04-29 ENCOUNTER — Encounter: Payer: 59 | Admitting: Gastroenterology

## 2017-05-13 ENCOUNTER — Ambulatory Visit (INDEPENDENT_AMBULATORY_CARE_PROVIDER_SITE_OTHER): Payer: 59 | Admitting: Neurology

## 2017-05-13 ENCOUNTER — Encounter: Payer: Self-pay | Admitting: Neurology

## 2017-05-13 VITALS — BP 100/70 | HR 63 | Ht 63.0 in | Wt 212.2 lb

## 2017-05-13 DIAGNOSIS — G5602 Carpal tunnel syndrome, left upper limb: Secondary | ICD-10-CM | POA: Diagnosis not present

## 2017-05-13 NOTE — Patient Instructions (Addendum)
NCS/EMG of the left hand Start using a left wrist brace to prevent flexion of the wrist  ELECTROMYOGRAM AND NERVE CONDUCTION STUDIES (EMG/NCS) INSTRUCTIONS  How to Prepare The neurologist conducting the EMG will need to know if you have certain medical conditions. Tell the neurologist and other EMG lab personnel if you: . Have a pacemaker or any other electrical medical device . Take blood-thinning medications . Have hemophilia, a blood-clotting disorder that causes prolonged bleeding Bathing Take a shower or bath shortly before your exam in order to remove oils from your skin. Don't apply lotions or creams before the exam.  What to Expect You'll likely be asked to change into a hospital gown for the procedure and lie down on an examination table. The following explanations can help you understand what will happen during the exam.  . Electrodes. The neurologist or a technician places surface electrodes at various locations on your skin depending on where you're experiencing symptoms. Or the neurologist may insert needle electrodes at different sites depending on your symptoms.  . Sensations. The electrodes will at times transmit a tiny electrical current that you may feel as a twinge or spasm. The needle electrode may cause discomfort or pain that usually ends shortly after the needle is removed. If you are concerned about discomfort or pain, you may want to talk to the neurologist about taking a short break during the exam.  . Instructions. During the needle EMG, the neurologist will assess whether there is any spontaneous electrical activity when the muscle is at rest - activity that isn't present in healthy muscle tissue - and the degree of activity when you slightly contract the muscle.  He or she will give you instructions on resting and contracting a muscle at appropriate times. Depending on what muscles and nerves the neurologist is examining, he or she may ask you to change positions during  the exam.  After your EMG You may experience some temporary, minor bruising where the needle electrode was inserted into your muscle. This bruising should fade within several days. If it persists, contact your primary care doctor.

## 2017-05-13 NOTE — Progress Notes (Signed)
[facility] Clinic Note - Initial Visit   Date: 05/13/17  Angela Shannon MRN: [id number] DOB: 08-Jan-1956   Dear Wilfred Lacy, NP:  Thank you for your kind referral of Angela Shannon for consultation of left hand paresthesias. Although her history is well known to you, please allow [place] to reiterate it for the purpose of our medical record. The patient was accompanied to the clinic by self.    History of Present Illness: Angela Shannon is a 61 y.o. right-handed female with migraines and GERD presenting for evaluation of left hand paresthesias.    On June 17th, 2018 she started having tingling over the left index finger.  She initially thought she had a splinter in her hand.  Since this time, she has numbness over the pad of the left index finger.  She has started to wake up with her left hand falling asleep. 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  During July, she was especially busy at work and recalls having numbness extend into her palm.  She denies any weakness of the left hand and does not have difficulty with opening jars/bottles.  She has history of ganglion cyst removal over the dorsal surface of the left hand.  She did not have carpal tunnel syndrome.  She has low vitamin B12 and takes supplements.  No history of diabetes.   Past Medical History:  Diagnosis Date  . Allergy    food   . Anemia   . Arthritis    "lower back; right 2nd toe" (09/17/2016)  . Family history of adverse reaction to anesthesia    "mom got combative"  . Frequent headaches    "seasonal" (09/17/2016)  . GERD (gastroesophageal reflux disease)   . Heart murmur    "was told I did; can't always find it" (09/17/2016)  . Migraine    "maybe 1/year" (09/17/2016)  . Pneumonia ~ 2005  . Refusal of blood transfusions as patient is Jehovah's Witness   . Seasonal allergies   . Small  bowel obstruction ([place]) 1983; 09/17/2016    Past Surgical History:  Procedure Laterality Date  . ABDOMINAL HYSTERECTOMY    . APPENDECTOMY    . BREAST BIOPSY Right 2010  . CARPAL TUNNEL RELEASE Left   . DILATION AND CURETTAGE OF UTERUS    . EXPLORATORY LAPAROTOMY  1983   small bowel obstruction Archie Endo 09/17/2016  . TONSILLECTOMY AND ADENOIDECTOMY       Medications:  Outpatient Encounter Prescriptions as of 05/13/2017  Medication Sig  . Cyanocobalamin (B-12 PO) Take by mouth daily.  Marland Kitchen ibuprofen (ADVIL,MOTRIN) 200 MG tablet Take 200 mg by mouth every 6 (six) hours as needed.  . [DISCONTINUED] Na Sulfate-K Sulfate-Mg Sulf (SUPREP BOWEL PREP KIT) 17.5-3.13-1.6 GM/180ML SOLN suprep as directed.  No substitutions   No facility-administered encounter medications on file as of 05/13/2017.      Allergies:  Allergies  Allergen Reactions  . Codeine Other (See Comments)    Jittery / Nervous   . Robaxin [Methocarbamol] Other (See Comments)    Numbness in arm  . Latex Rash  . Sunscreens Rash    Family History: Family History  Problem Relation Age of Onset  . Arthritis Mother   . Heart disease Maternal Grandmother   . Stroke Maternal Grandmother   . Diabetes Maternal Grandmother   . Hypertension Paternal Grandmother   . Cancer Sister  breast  . Breast cancer Sister 17  . Cancer Paternal Aunt        breast  . Breast cancer Paternal Aunt        onset in her 63s  . Breast cancer Paternal Aunt   . Colon cancer Paternal Aunt        is half aunt    Social History: Social History  Substance Use Topics  . Smoking status: Former Smoker    Packs/day: 1.00    Years: 7.00    Types: Cigarettes    Quit date: 1984  . Smokeless tobacco: Never Used  . Alcohol use 1.2 oz/week    2 Standard drinks or equivalent per week     Comment: 09/17/2016 "couple glasses of wine/month; if  that"   Social History   Social History Narrative   Lives alone in an apartment on the first  floor.  Has one child.  Works as a Systems developer.  Education: college.       Review of Systems:  CONSTITUTIONAL: No fevers, chills, night sweats, or weight loss.   EYES: No visual changes or eye pain ENT: No hearing changes.  No history of nose bleeds.   RESPIRATORY: No cough, wheezing and shortness of breath.   CARDIOVASCULAR: Negative for chest pain, and palpitations.   GI: Negative for abdominal discomfort, blood in stools or black stools.  No recent change in bowel habits.   GU:  No history of incontinence.   MUSCLOSKELETAL: No history of joint pain or swelling.  No myalgias.   SKIN: Negative for lesions, rash, and itching.   HEMATOLOGY/ONCOLOGY: Negative for prolonged bleeding, bruising easily, and swollen nodes.  No history of cancer.   ENDOCRINE: Negative for cold or heat intolerance, polydipsia or goiter.   PSYCH:  No depression or anxiety symptoms.   NEURO: As Above.   Vital Signs:  BP 100/70   Pulse 63   Ht 5' 3"  (1.6 m)   Wt 212 lb 4 oz (96.3 kg)   SpO2 98%   BMI 37.60 kg/m    General Medical Exam:   General:  Well appearing, comfortable.   Eyes/ENT: see cranial nerve examination.   Neck: No masses appreciated.  Full range of motion without tenderness.  No carotid bruits. Respiratory:  Clear to auscultation, good air entry bilaterally.   Cardiac:  Regular rate and rhythm, no murmur.   Extremities:  No deformities, edema, or skin discoloration.  Skin:  No rashes or lesions.  Neurological Exam: MENTAL STATUS including orientation to time, place, person, recent and remote memory, attention span and concentration, language, and fund of knowledge is normal.  Speech is not dysarthric.  CRANIAL NERVES: II:  No visual field defects.  Unremarkable fundi.   III-IV-VI: Pupils equal round and reactive to light.  Normal conjugate, extra-ocular eye movements in all directions of gaze.  No nystagmus.  No ptosis.   V:  Normal facial sensation.    VII:  Normal facial  symmetry and movements. VIII:  Normal hearing and vestibular function.   IX-X:  Normal palatal movement.   XI:  Normal shoulder shrug and head rotation.   XII:  Normal tongue strength and range of motion, no deviation or fasciculation.  MOTOR: Motor strength is 5/5 throughout, including left ABP  No atrophy, fasciculations or abnormal movements.  No pronator drift.  Tone is normal.    MSRs:  Right  Left brachioradialis 2+  brachioradialis 2+  biceps 2+  biceps 2+  triceps 2+  triceps 2+  patellar 2+  patellar 2+  ankle jerk 2+  ankle jerk 2+  Hoffman no  Hoffman no  plantar response down  plantar response down   SENSORY:  Normal and symmetric perception of light touch, pinprick, vibration, and proprioception.  Tinel's sign at the left wrist is absent.   COORDINATION/GAIT: Normal finger-to- nose-finger.  Intact rapid alternating movements bilaterally. Gait narrow based and stable.   DATA: Lab Results  Component Value Date   VITAMINB12 240 03/02/2017   Lab Results  Component Value Date   HGBA1C 5.7 11/27/2015     IMPRESSION: Angela Shannon is a 61 year-old female referred for evaluation of left index finger paresthesias.  Her exam is normal with negative Tinel's sign at the wrist; however, her history is most suggestive of left carpal tunnel syndrome. She denies painful paresthesias and is most likely bothered by the constant numbness of her index finger  NCS/EMG of the left hand will be ordered to better characterize the nature of her symptoms and assess severity.  In the meantime, I recommend that she start using a wrist splint during the day and night.    Thank you for allowing me to participate in patient's care.  If I can answer any additional questions, I would be pleased to do so.    Sincerely,    Donika K. Posey Pronto, DO

## 2017-06-01 ENCOUNTER — Ambulatory Visit (INDEPENDENT_AMBULATORY_CARE_PROVIDER_SITE_OTHER): Payer: 59 | Admitting: Neurology

## 2017-06-01 DIAGNOSIS — G5602 Carpal tunnel syndrome, left upper limb: Secondary | ICD-10-CM | POA: Diagnosis not present

## 2017-06-01 NOTE — Procedures (Signed)
[facility]  [address], [address]  [place], [place] [address] Tel: [phone] Fax:  [phone] Test Date:  06/01/2017  Patient: Angela Shannon DOB: 1956/03/21 Physician: Nita Sickleonika Patel, DO  Sex: Female Height: 5\' 3"  Ref Phys: Nita Sickleonika Patel, DO  ID#: [id number] Temp: 35.2C Technician:    Patient Complaints: This is a 61 year-old female referred for evaluation of left hand paresthesias.  NCV & EMG Findings: Extensive electrodiagnostic testing of the left upper extremity shows:  1. Left mixed palmar sensory responses are abnormal. Left median and ulnar sensory responses are within normal limits. 2. Left median and ulnar motor responses are within normal limits. 3. There is no evidence of active or chronic motor axon loss changes affecting any of the tested muscles. Motor unit configuration and recruitment pattern is within normal limits.  Impression: Left median neuropathy at or distal to the wrist, consistent with clinical diagnosis of carpal tunnel syndrome. Overall, these findings are very mild in degree electrically.   ___________________________ Nita Sickleonika Patel, DO    Nerve Conduction Studies Anti Sensory Summary Table   Stim Site NR Peak (ms) Norm Peak (ms) P-T Amp (V) Norm P-T Amp  Left Median Anti Sensory (2nd Digit)  35.2C  Wrist    2.8 <3.8 36.9 >10  Left Ulnar Anti Sensory (5th Digit)  35.2C  Wrist    2.6 <3.2 36.0 >5   Motor Summary Table   Stim Site NR Onset (ms) Norm Onset (ms) O-P Amp (mV) Norm O-P Amp Site1 Site2 Delta-0 (ms) Dist (cm) Vel (m/s) Norm Vel (m/s)  Left Median Motor (Abd Poll Brev)  35.2C  Wrist    2.6 <4.0 14.0 >5 Elbow Wrist 4.4 28.0 64 >50  Elbow    7.0  13.0         Left Ulnar Motor (Abd Dig Minimi)  35.2C  Wrist    2.1 <3.1 8.7 >7 B Elbow Wrist 3.8 24.0 63 >50  B Elbow    5.9  8.6  A Elbow B Elbow 1.4 10.0 71 >50  A Elbow    7.3  8.1          Comparison Summary Table   Stim Site NR Peak (ms) Norm Peak (ms) P-T  Amp (V) Site1 Site2 Delta-P (ms) Norm Delta (ms)  Left Median/Ulnar Palm Comparison (Wrist - 8cm)  35.2C  Median Palm    1.8 <2.2 58.6 Median Palm Ulnar Palm 0.4   Ulnar Palm    1.4 <2.2 9.7       EMG   Side Muscle Ins Act Fibs Psw Fasc Number Recrt Dur Dur. Amp Amp. Poly Poly. Comment  Left 1stDorInt Nml Nml Nml Nml Nml Nml Nml Nml Nml Nml Nml Nml N/A  Left Abd Poll Brev Nml Nml Nml Nml Nml Nml Nml Nml Nml Nml Nml Nml N/A  Left Ext Indicis Nml Nml Nml Nml Nml Nml Nml Nml Nml Nml Nml Nml N/A  Left PronatorTeres Nml Nml Nml Nml Nml Nml Nml Nml Nml Nml Nml Nml N/A  Left Biceps Nml Nml Nml Nml Nml Nml Nml Nml Nml Nml Nml Nml N/A  Left Triceps Nml Nml Nml Nml Nml Nml Nml Nml Nml Nml Nml Nml N/A  Left Deltoid Nml Nml Nml Nml Nml Nml Nml Nml Nml Nml Nml Nml N/A      Waveforms:

## 2017-06-28 ENCOUNTER — Other Ambulatory Visit: Payer: Self-pay | Admitting: Internal Medicine

## 2017-06-28 DIAGNOSIS — Z1231 Encounter for screening mammogram for malignant neoplasm of breast: Secondary | ICD-10-CM

## 2017-07-29 ENCOUNTER — Ambulatory Visit
Admission: RE | Admit: 2017-07-29 | Discharge: 2017-07-29 | Disposition: A | Discharge status: Home / Self Care | Payer: 59 | Source: Ambulatory Visit | Attending: Internal Medicine | Admitting: Internal Medicine

## 2017-07-29 ENCOUNTER — Telehealth: Payer: Self-pay | Admitting: Internal Medicine

## 2017-07-29 DIAGNOSIS — Z1231 Encounter for screening mammogram for malignant neoplasm of breast: Secondary | ICD-10-CM

## 2017-07-29 NOTE — Telephone Encounter (Signed)
Copied from CRM [id number]. Topic: Referral - Request >> Jul 29, 2017 12:44 PM [name], [name] wrote: Patient just wanted to let the office know that [facility] will be faxing over a request to allow her to get a referral for diganoistic mammo

## 2017-07-30 ENCOUNTER — Other Ambulatory Visit: Payer: Self-pay | Admitting: Internal Medicine

## 2017-07-30 DIAGNOSIS — N644 Mastodynia: Secondary | ICD-10-CM

## 2017-07-30 NOTE — Telephone Encounter (Signed)
Noted  

## 2017-07-30 NOTE — Telephone Encounter (Signed)
Received and faxed

## 2017-08-11 ENCOUNTER — Ambulatory Visit
Admission: RE | Admit: 2017-08-11 | Discharge: 2017-08-11 | Disposition: A | Discharge status: Home / Self Care | Payer: 59 | Source: Ambulatory Visit | Attending: Internal Medicine | Admitting: Internal Medicine

## 2017-08-11 DIAGNOSIS — N644 Mastodynia: Secondary | ICD-10-CM
